# Patient Record
Sex: Female | Born: 1975 | Hispanic: No | Smoking: Never smoker
Health system: Southern US, Community
[De-identification: ages and names within clinical notes are randomized; demographics above are authoritative.]

## PROBLEM LIST (undated history)

## (undated) DIAGNOSIS — E079 Disorder of thyroid, unspecified: Secondary | ICD-10-CM

## (undated) DIAGNOSIS — IMO0002 Reserved for concepts with insufficient information to code with codable children: Secondary | ICD-10-CM

## (undated) DIAGNOSIS — E039 Hypothyroidism, unspecified: Secondary | ICD-10-CM

## (undated) DIAGNOSIS — T7840XA Allergy, unspecified, initial encounter: Secondary | ICD-10-CM

## (undated) DIAGNOSIS — L509 Urticaria, unspecified: Secondary | ICD-10-CM

## (undated) DIAGNOSIS — F329 Major depressive disorder, single episode, unspecified: Secondary | ICD-10-CM

## (undated) DIAGNOSIS — J45909 Unspecified asthma, uncomplicated: Secondary | ICD-10-CM

## (undated) DIAGNOSIS — D649 Anemia, unspecified: Secondary | ICD-10-CM

## (undated) HISTORY — DX: Unspecified asthma, uncomplicated: J45.909

## (undated) HISTORY — DX: Allergy, unspecified, initial encounter: T78.40XA

## (undated) HISTORY — DX: Hypothyroidism, unspecified: E03.9

## (undated) HISTORY — PX: NO PAST SURGERIES: SHX2092

## (undated) HISTORY — DX: Anemia, unspecified: D64.9

## (undated) HISTORY — DX: Urticaria, unspecified: L50.9

## (undated) HISTORY — DX: Reserved for concepts with insufficient information to code with codable children: IMO0002

## (undated) HISTORY — DX: Major depressive disorder, single episode, unspecified: F32.9

---

## 2001-02-25 ENCOUNTER — Encounter: Payer: Self-pay | Admitting: Emergency Medicine

## 2001-02-25 ENCOUNTER — Emergency Department (HOSPITAL_COMMUNITY): Admission: EM | Admit: 2001-02-25 | Discharge: 2001-02-25 | Payer: Self-pay | Admitting: Emergency Medicine

## 2003-03-14 ENCOUNTER — Encounter: Payer: Self-pay | Admitting: Emergency Medicine

## 2003-03-14 ENCOUNTER — Emergency Department (HOSPITAL_COMMUNITY): Admission: EM | Admit: 2003-03-14 | Discharge: 2003-03-14 | Payer: Self-pay | Admitting: Emergency Medicine

## 2004-12-01 ENCOUNTER — Ambulatory Visit: Payer: Self-pay | Admitting: Internal Medicine

## 2005-02-01 ENCOUNTER — Emergency Department (HOSPITAL_COMMUNITY): Admission: EM | Admit: 2005-02-01 | Discharge: 2005-02-01 | Payer: Self-pay | Admitting: Emergency Medicine

## 2005-04-13 ENCOUNTER — Emergency Department (HOSPITAL_COMMUNITY): Admission: EM | Admit: 2005-04-13 | Discharge: 2005-04-13 | Payer: Self-pay | Admitting: Emergency Medicine

## 2005-06-23 ENCOUNTER — Ambulatory Visit: Payer: Self-pay | Admitting: Internal Medicine

## 2005-06-24 ENCOUNTER — Ambulatory Visit: Payer: Self-pay | Admitting: Internal Medicine

## 2005-06-25 ENCOUNTER — Ambulatory Visit: Payer: Self-pay | Admitting: Internal Medicine

## 2005-09-02 ENCOUNTER — Ambulatory Visit: Payer: Self-pay | Admitting: Internal Medicine

## 2005-09-16 ENCOUNTER — Ambulatory Visit: Payer: Self-pay | Admitting: *Deleted

## 2005-09-22 ENCOUNTER — Emergency Department (HOSPITAL_COMMUNITY): Admission: EM | Admit: 2005-09-22 | Discharge: 2005-09-22 | Payer: Self-pay | Admitting: Emergency Medicine

## 2005-10-21 ENCOUNTER — Ambulatory Visit: Payer: Self-pay | Admitting: Internal Medicine

## 2008-09-07 ENCOUNTER — Emergency Department (HOSPITAL_COMMUNITY): Admission: EM | Admit: 2008-09-07 | Discharge: 2008-09-07 | Payer: Self-pay | Admitting: Emergency Medicine

## 2010-01-10 ENCOUNTER — Inpatient Hospital Stay (HOSPITAL_COMMUNITY): Admission: AD | Admit: 2010-01-10 | Discharge: 2010-01-10 | Payer: Self-pay | Admitting: Obstetrics & Gynecology

## 2010-03-26 ENCOUNTER — Ambulatory Visit (HOSPITAL_COMMUNITY): Admission: RE | Admit: 2010-03-26 | Discharge: 2010-03-26 | Payer: Self-pay | Admitting: Obstetrics

## 2010-06-04 ENCOUNTER — Inpatient Hospital Stay (HOSPITAL_COMMUNITY): Admission: AD | Admit: 2010-06-04 | Discharge: 2010-06-04 | Payer: Self-pay | Admitting: Obstetrics

## 2010-06-04 ENCOUNTER — Ambulatory Visit: Payer: Self-pay | Admitting: Advanced Practice Midwife

## 2010-07-06 ENCOUNTER — Ambulatory Visit: Payer: Self-pay | Admitting: Nurse Practitioner

## 2010-07-06 ENCOUNTER — Inpatient Hospital Stay (HOSPITAL_COMMUNITY): Admission: AD | Admit: 2010-07-06 | Discharge: 2010-07-06 | Payer: Self-pay | Admitting: Obstetrics

## 2010-08-19 ENCOUNTER — Inpatient Hospital Stay (HOSPITAL_COMMUNITY): Admission: AD | Admit: 2010-08-19 | Discharge: 2010-08-22 | Payer: Self-pay | Admitting: Obstetrics

## 2010-08-20 ENCOUNTER — Encounter (INDEPENDENT_AMBULATORY_CARE_PROVIDER_SITE_OTHER): Payer: Self-pay | Admitting: Obstetrics

## 2011-01-13 ENCOUNTER — Emergency Department (HOSPITAL_COMMUNITY)
Admission: EM | Admit: 2011-01-13 | Discharge: 2011-01-13 | Payer: Self-pay | Source: Home / Self Care | Admitting: Emergency Medicine

## 2011-02-26 LAB — CBC
Hemoglobin: 12.9 g/dL (ref 12.0–15.0)
MCH: 29.2 pg (ref 26.0–34.0)
MCHC: 33.4 g/dL (ref 30.0–36.0)
MCHC: 33.5 g/dL (ref 30.0–36.0)
MCV: 86.1 fL (ref 78.0–100.0)
MCV: 87.6 fL (ref 78.0–100.0)
Platelets: 207 10*3/uL (ref 150–400)
RBC: 3.6 MIL/uL — ABNORMAL LOW (ref 3.87–5.11)
RBC: 4.47 MIL/uL (ref 3.87–5.11)
RDW: 15.2 % (ref 11.5–15.5)
WBC: 10 10*3/uL (ref 4.0–10.5)
WBC: 8.8 10*3/uL (ref 4.0–10.5)

## 2011-03-01 LAB — URINALYSIS, ROUTINE W REFLEX MICROSCOPIC
Bilirubin Urine: NEGATIVE
Ketones, ur: NEGATIVE mg/dL
Nitrite: NEGATIVE
Protein, ur: NEGATIVE mg/dL
Specific Gravity, Urine: 1.025 (ref 1.005–1.030)

## 2011-03-02 LAB — URINALYSIS, ROUTINE W REFLEX MICROSCOPIC: Glucose, UA: NEGATIVE mg/dL

## 2011-03-02 LAB — POCT PREGNANCY, URINE: Preg Test, Ur: POSITIVE

## 2011-08-29 ENCOUNTER — Emergency Department (HOSPITAL_COMMUNITY)
Admission: EM | Admit: 2011-08-29 | Discharge: 2011-08-29 | Disposition: A | Discharge status: Home / Self Care | Payer: Self-pay | Attending: Emergency Medicine | Admitting: Emergency Medicine

## 2011-08-29 DIAGNOSIS — X58XXXA Exposure to other specified factors, initial encounter: Secondary | ICD-10-CM | POA: Insufficient documentation

## 2011-08-29 DIAGNOSIS — H11419 Vascular abnormalities of conjunctiva, unspecified eye: Secondary | ICD-10-CM | POA: Insufficient documentation

## 2011-08-29 DIAGNOSIS — E0590 Thyrotoxicosis, unspecified without thyrotoxic crisis or storm: Secondary | ICD-10-CM | POA: Insufficient documentation

## 2011-08-29 DIAGNOSIS — H1089 Other conjunctivitis: Secondary | ICD-10-CM | POA: Insufficient documentation

## 2011-08-29 DIAGNOSIS — H5789 Other specified disorders of eye and adnexa: Secondary | ICD-10-CM | POA: Insufficient documentation

## 2011-08-29 DIAGNOSIS — S0500XA Injury of conjunctiva and corneal abrasion without foreign body, unspecified eye, initial encounter: Secondary | ICD-10-CM | POA: Insufficient documentation

## 2011-08-29 DIAGNOSIS — H1189 Other specified disorders of conjunctiva: Secondary | ICD-10-CM | POA: Insufficient documentation

## 2011-08-29 DIAGNOSIS — H53149 Visual discomfort, unspecified: Secondary | ICD-10-CM | POA: Insufficient documentation

## 2011-09-14 LAB — CBC
HCT: 35.2 — ABNORMAL LOW
MCHC: 32.3
MCV: 82.8
RBC: 4.25

## 2011-09-14 LAB — GC/CHLAMYDIA PROBE AMP, GENITAL: Chlamydia, DNA Probe: NEGATIVE

## 2011-09-14 LAB — URINALYSIS, ROUTINE W REFLEX MICROSCOPIC
Bilirubin Urine: NEGATIVE
Ketones, ur: NEGATIVE
Nitrite: NEGATIVE
Protein, ur: NEGATIVE
Specific Gravity, Urine: 1.026
Urobilinogen, UA: 1

## 2011-09-14 LAB — DIFFERENTIAL
Basophils Absolute: 0
Basophils Relative: 0
Eosinophils Relative: 2
Monocytes Absolute: 0.4
Monocytes Relative: 6
Neutrophils Relative %: 55

## 2011-09-14 LAB — WET PREP, GENITAL
Trich, Wet Prep: NONE SEEN
WBC, Wet Prep HPF POC: NONE SEEN
Yeast Wet Prep HPF POC: NONE SEEN

## 2011-09-14 LAB — POCT I-STAT, CHEM 8
Hemoglobin: 12.2
Sodium: 138

## 2012-02-23 ENCOUNTER — Other Ambulatory Visit (HOSPITAL_COMMUNITY): Payer: Self-pay | Admitting: Physician Assistant

## 2012-05-11 ENCOUNTER — Encounter (HOSPITAL_COMMUNITY): Payer: Self-pay | Admitting: Emergency Medicine

## 2012-05-11 ENCOUNTER — Emergency Department (HOSPITAL_COMMUNITY)
Admission: EM | Admit: 2012-05-11 | Discharge: 2012-05-11 | Disposition: A | Discharge status: Home / Self Care | Payer: Self-pay | Attending: Emergency Medicine | Admitting: Emergency Medicine

## 2012-05-11 ENCOUNTER — Emergency Department (HOSPITAL_COMMUNITY): Payer: Self-pay

## 2012-05-11 DIAGNOSIS — R0982 Postnasal drip: Secondary | ICD-10-CM | POA: Insufficient documentation

## 2012-05-11 DIAGNOSIS — R059 Cough, unspecified: Secondary | ICD-10-CM | POA: Insufficient documentation

## 2012-05-11 DIAGNOSIS — R079 Chest pain, unspecified: Secondary | ICD-10-CM | POA: Insufficient documentation

## 2012-05-11 DIAGNOSIS — L2989 Other pruritus: Secondary | ICD-10-CM | POA: Insufficient documentation

## 2012-05-11 DIAGNOSIS — J302 Other seasonal allergic rhinitis: Secondary | ICD-10-CM

## 2012-05-11 DIAGNOSIS — L298 Other pruritus: Secondary | ICD-10-CM | POA: Insufficient documentation

## 2012-05-11 DIAGNOSIS — J3489 Other specified disorders of nose and nasal sinuses: Secondary | ICD-10-CM | POA: Insufficient documentation

## 2012-05-11 DIAGNOSIS — R5381 Other malaise: Secondary | ICD-10-CM | POA: Insufficient documentation

## 2012-05-11 DIAGNOSIS — J309 Allergic rhinitis, unspecified: Secondary | ICD-10-CM | POA: Insufficient documentation

## 2012-05-11 DIAGNOSIS — D649 Anemia, unspecified: Secondary | ICD-10-CM | POA: Insufficient documentation

## 2012-05-11 DIAGNOSIS — R51 Headache: Secondary | ICD-10-CM | POA: Insufficient documentation

## 2012-05-11 DIAGNOSIS — R05 Cough: Secondary | ICD-10-CM | POA: Insufficient documentation

## 2012-05-11 HISTORY — DX: Disorder of thyroid, unspecified: E07.9

## 2012-05-11 LAB — CBC
HCT: 32 % — ABNORMAL LOW (ref 36.0–46.0)
MCHC: 33.1 g/dL (ref 30.0–36.0)
Platelets: 223 10*3/uL (ref 150–400)
RDW: 13.4 % (ref 11.5–15.5)

## 2012-05-11 LAB — DIFFERENTIAL
Basophils Absolute: 0 10*3/uL (ref 0.0–0.1)
Eosinophils Relative: 3 % (ref 0–5)
Lymphocytes Relative: 54 % — ABNORMAL HIGH (ref 12–46)
Neutro Abs: 2.5 10*3/uL (ref 1.7–7.7)

## 2012-05-11 LAB — POCT I-STAT, CHEM 8
Chloride: 105 mEq/L (ref 96–112)
Glucose, Bld: 88 mg/dL (ref 70–99)
Potassium: 3.6 mEq/L (ref 3.5–5.1)
Sodium: 141 mEq/L (ref 135–145)

## 2012-05-11 MED ORDER — CETIRIZINE HCL 10 MG PO TABS: 10.0000 mg | ORAL_TABLET | Freq: Every day | ORAL | Status: DC

## 2012-05-11 MED ORDER — FERROUS SULFATE 325 (65 FE) MG PO TABS: 325.0000 mg | ORAL_TABLET | Freq: Every day | ORAL | Status: DC

## 2012-05-11 NOTE — Discharge Instructions (Signed)
You were seen and evaluated for your symptoms of nasal congestion, runny nose, voice change and fatigue. Your providers feel your symptoms are caused from seasonal allergies. It is recommended you take a daily allergies medication to help with your symptoms over the next week. Your providers also feel you some of your fatigue and tiredness is caused from anemia. Please take iron vitamins daily for the next several weeks to help with these symptoms. Please followup with primary care provider for continued evaluation and treatment.   Allergies, Generic Allergies may happen from anything your body is sensitive to. This may be food, medicines, pollens, chemicals, and nearly anything around you in everyday life that produces allergens. An allergen is anything that causes an allergy producing substance. Heredity is often a factor in causing these problems. This means you may have some of the same allergies as your parents. Food allergies happen in all age groups. Food allergies are some of the most severe and life threatening. Some common food allergies are cow's milk, seafood, eggs, nuts, wheat, and soybeans. SYMPTOMS   Swelling around the mouth.   An itchy red rash or hives.   Vomiting or diarrhea.   Difficulty breathing.  SEVERE ALLERGIC REACTIONS ARE LIFE-THREATENING. This reaction is called anaphylaxis. It can cause the mouth and throat to swell and cause difficulty with breathing and swallowing. In severe reactions only a trace amount of food (for example, peanut oil in a salad) may cause death within seconds. Seasonal allergies occur in all age groups. These are seasonal because they usually occur during the same season every year. They may be a reaction to molds, grass pollens, or tree pollens. Other causes of problems are house dust mite allergens, pet dander, and mold spores. The symptoms often consist of nasal congestion, a runny itchy nose associated with sneezing, and tearing itchy eyes.  There is often an associated itching of the mouth and ears. The problems happen when you come in contact with pollens and other allergens. Allergens are the particles in the air that the body reacts to with an allergic reaction. This causes you to release allergic antibodies. Through a chain of events, these eventually cause you to release histamine into the blood stream. Although it is meant to be protective to the body, it is this release that causes your discomfort. This is why you were given anti-histamines to feel better. If you are unable to pinpoint the offending allergen, it may be determined by skin or blood testing. Allergies cannot be cured but can be controlled with medicine. Hay fever is a collection of all or some of the seasonal allergy problems. It may often be treated with simple over-the-counter medicine such as diphenhydramine. Take medicine as directed. Do not drink alcohol or drive while taking this medicine. Check with your caregiver or package insert for child dosages. If these medicines are not effective, there are many new medicines your caregiver can prescribe. Stronger medicine such as nasal spray, eye drops, and corticosteroids may be used if the first things you try do not work well. Other treatments such as immunotherapy or desensitizing injections can be used if all else fails. Follow up with your caregiver if problems continue. These seasonal allergies are usually not life threatening. They are generally more of a nuisance that can often be handled using medicine. HOME CARE INSTRUCTIONS   If unsure what causes a reaction, keep a diary of foods eaten and symptoms that follow. Avoid foods that cause reactions.   If hives  or rash are present:   Take medicine as directed.   You may use an over-the-counter antihistamine (diphenhydramine) for hives and itching as needed.   Apply cold compresses (cloths) to the skin or take baths in cool water. Avoid hot baths or showers. Heat  will make a rash and itching worse.   If you are severely allergic:   Following a treatment for a severe reaction, hospitalization is often required for closer follow-up.   Wear a medic-alert bracelet or necklace stating the allergy.   You and your family must learn how to give adrenaline or use an anaphylaxis kit.   If you have had a severe reaction, always carry your anaphylaxis kit or EpiPen with you. Use this medicine as directed by your caregiver if a severe reaction is occurring. Failure to do so could have a fatal outcome.  SEEK MEDICAL CARE IF:  You suspect a food allergy. Symptoms generally happen within 30 minutes of eating a food.   Your symptoms have not gone away within 2 days or are getting worse.   You develop new symptoms.   You want to retest yourself or your child with a food or drink you think causes an allergic reaction. Never do this if an anaphylactic reaction to that food or drink has happened before. Only do this under the care of a caregiver.  SEEK IMMEDIATE MEDICAL CARE IF:   You have difficulty breathing, are wheezing, or have a tight feeling in your chest or throat.   You have a swollen mouth, or you have hives, swelling, or itching all over your body.   You have had a severe reaction that has responded to your anaphylaxis kit or an EpiPen. These reactions may return when the medicine has worn off. These reactions should be considered life threatening.  MAKE SURE YOU:   Understand these instructions.   Will watch your condition.   Will get help right away if you are not doing well or get worse.  Document Released: 02/23/2003 Document Revised: 11/19/2011 Document Reviewed: 07/30/2008 [facility] Patient Information 2012 [place], [place].    Iron Deficiency Anemia There are many types of anemia. Iron deficiency anemia is the most common. Iron deficiency anemia is a decrease in the number of red blood cells caused by too little iron. Without enough iron,  your body does not produce enough hemoglobin. Hemoglobin is a substance in red blood cells that carries oxygen to the body's tissues. Iron deficiency anemia may leave you tired and short of breath. CAUSES   Lack of iron in the diet.   This may be seen in infants and children, because there is little iron in milk.   This may be seen in adults who do not eat enough iron-rich foods.   This may be seen in pregnant or breastfeeding women who do not take iron supplements. There is a much higher need for iron intake at these times.   Poor absorption of iron, as seen with intestinal disorders.   Intestinal bleeding.   Heavy periods.  SYMPTOMS  Mild anemia may not be noticeable. Symptoms may include:  Fatigue.   Headache.   Pale skin.   Weakness.   Shortness of breath.   Dizziness.   Cold hands and feet.   Fast or irregular heartbeat.  DIAGNOSIS  Diagnosis requires a thorough evaluation and physical exam by your caregiver.  Blood tests are generally used to confirm iron deficiency anemia.   Additional tests may be done to find the underlying  cause of your anemia. These may include:   Testing for blood in the stool (fecal occult blood test).   A procedure to see inside the colon and rectum (colonoscopy).   A procedure to see inside the esophagus and stomach (endoscopy).  TREATMENT   Correcting the cause of the iron deficiency is the first step.   Medicines, such as oral contraceptives, can make heavy menstrual flows lighter.   Antibiotics and other medicines can be used to treat peptic ulcers.   Surgery may be needed to remove a bleeding polyp, tumor, or fibroid.   Often, iron supplements (ferrous sulfate) are taken.   For the best iron absorption, take these supplements with an empty stomach.   You may need to take the supplements with food if you cannot tolerate them on an empty stomach. Vitamin C improves the absorption of iron. Your caregiver may recommend taking  your iron tablets with a glass of orange juice or vitamin C supplement.   Milk and antacids should not be taken at the same time as iron supplements. They may interfere with the absorption of iron.   Iron supplements can cause constipation. A stool softener is often recommended.   Pregnant and breastfeeding women will need to take extra iron, because their normal diet usually will not provide the required amount.   Patients who cannot tolerate iron by mouth can take it through a vein (intravenously) or by an injection into the muscle.  HOME CARE INSTRUCTIONS   Ask your dietitian for help with diet questions.   Take iron and vitamins as directed by your caregiver.   Eat a diet rich in iron. Eat liver, lean beef, whole-grain bread, eggs, dried fruit, and dark green leafy vegetables.  SEEK IMMEDIATE MEDICAL CARE IF:   You have a fainting episode. Do not drive yourself. Call your local emergency services (911 in [place] if no other help is available.   You have chest pain, nausea, or vomiting.   You develop severe or increased shortness of breath with activities.   You develop weakness or increased thirst.   You have a rapid heartbeat.   You develop unexplained sweating or become lightheaded when getting up from a chair or bed.  MAKE SURE YOU:   Understand these instructions.   Will watch your condition.   Will get help right away if you are not doing well or get worse.  Document Released: 11/27/2000 Document Revised: 11/19/2011 Document Reviewed: 04/08/2010 [facility] Patient Information 2012 [place], [place].   RESOURCE GUIDE  Dental Problems  Patients with Medicaid: [facility] [phone] [address]                                           [phone] [address] Phone:  [phone]                                                  Phone:  [phone]  If unable to pay or uninsured, contact:  [facility] or [facility]. to become qualified for the adult dental clinic.  Chronic Pain Problems Contact [facility]  [facility]  [phone] Patients need to be referred by their primary care doctor.  Insufficient Money for Medicine Contact United Way:  call "211" or [facility] [phone].  No Primary Care Doctor Call [facility]  [phone] Other agencies that provide inexpensive medical care    [facility]  [phone]    [facility]  [phone]    [facility]  [phone]    [facility]  [phone]    [facility]  [phone]    [facility]  [phone]  Psychological Services [facility]  [phone] [facility] Services  [phone] [facility]   [phone] (emergency services [phone])  Substance Abuse Resources [facility]  [phone] [facility] [phone] [facility] [phone] Floydene Flock [phone] Residential & Outpatient Substance Abuse Program  [phone]  Abuse/Neglect [facility] Child Abuse Hotline [phone] [facility] Child Abuse Hotline [phone] (After Hours)  Emergency Shelter [facility] [phone]  Maternity Homes [facility] [place] [facility] [phone] [facility] [phone]  MRSA Hotline #:   516-042-2124    [facility] Resources  [facility] [place]     [employer]                          [facility] Dept. [address] [place]                       [address]      [address]  Blondell Reveal Phone:  [phone]                                   Phone:  [phone]                 Phone:  [phone]  [facility] Phone:  [phone]  [facility] Child Abuse Hotline 918-831-8513 [phone] (After Hours)

## 2012-05-11 NOTE — ED Provider Notes (Signed)
History     CSN: [id number]  Arrival date & time 05/11/12  7829   First MD Initiated Contact with Patient 05/11/12 2119      Chief Complaint  Patient presents with  . Headache  . Chest Pain    HPI  History provided by the patient. Patient is a 36 year old female with history of hypothyroidism who presents with complaints of generalized fatigue, runny nose, congestion, itchy and watery eyes for the past several weeks to months. Symptoms are mild to moderate. Patient states that she thought her symptoms may be allergies she is used an occasional allergy medicine but nothing regularly. She also reports occasional cough with some sputum, most typically in the mornings. She denies any other aggravating or alleviating factors. Patient denies any fever, chills, sweats.   Past Medical History  Diagnosis Date  . Thyroid disease     History reviewed. No pertinent past surgical history.  History reviewed. No pertinent family history.  History  Substance Use Topics  . Smoking status: Never Smoker   . Smokeless tobacco: Not on file  . Alcohol Use: No     socailly    OB History    Grav Para Term Preterm Abortions TAB SAB Ect Mult Living                  Review of Systems  Constitutional: Positive for fatigue. Negative for fever and chills.  HENT: Positive for congestion, sore throat, rhinorrhea and postnasal drip.   Respiratory: Positive for cough. Negative for shortness of breath.   Cardiovascular: Negative for chest pain, palpitations and leg swelling.  Gastrointestinal: Negative for nausea, vomiting, abdominal pain and diarrhea.  Genitourinary: Negative for dysuria, frequency, hematuria and flank pain.    Allergies  Benzyl alcohol and Nickel  Home Medications   Current Outpatient Rx  Name Route Sig Dispense Refill  . ALLERGY AM/PM PO Oral Take 1 tablet by mouth every evening. For allergies      BP 111/81  Pulse 83  Temp(Src) 99 F (37.2 C) (Oral)  Resp 20  SpO2  100%  Physical Exam  Nursing note and vitals reviewed. Constitutional: She is oriented to person, place, and time. She appears well-developed and well-nourished. No distress.  HENT:  Head: Normocephalic and atraumatic.  Right Ear: Tympanic membrane normal.  Left Ear: Tympanic membrane normal.       Mild erythema of pharynx with cobblestoning. Tonsils normal without exudate. Uvula midline.  Neck: Normal range of motion. Neck supple. No tracheal deviation present.       Hoarse voice.  Cardiovascular: Normal rate and regular rhythm.   Pulmonary/Chest: Effort normal and breath sounds normal. No stridor. No respiratory distress. She has no wheezes. She has no rales.  Abdominal: Soft. There is no tenderness.  Musculoskeletal: She exhibits no edema and no tenderness.  Lymphadenopathy:    She has no cervical adenopathy.  Neurological: She is alert and oriented to person, place, and time.  Skin: Skin is warm and dry. No rash noted.  Psychiatric: She has a normal mood and affect. Her behavior is normal.    ED Course  Procedures  Results for orders placed during the hospital encounter of 05/11/12  CBC      Component Value Range   WBC 6.4  4.0 - 10.5 (K/uL)   RBC 3.55 (*) 3.87 - 5.11 (MIL/uL)   Hemoglobin 10.6 (*) 12.0 - 15.0 (g/dL)   HCT 56.2 (*) 13.0 - 46.0 (%)   MCV 90.1  78.0 -  100.0 (fL)   MCH 29.9  26.0 - 34.0 (pg)   MCHC 33.1  30.0 - 36.0 (g/dL)   RDW 16.1  09.6 - 04.5 (%)   Platelets 223  150 - 400 (K/uL)  DIFFERENTIAL      Component Value Range   Neutrophils Relative 39 (*) 43 - 77 (%)   Neutro Abs 2.5  1.7 - 7.7 (K/uL)   Lymphocytes Relative 54 (*) 12 - 46 (%)   Lymphs Abs 3.5  0.7 - 4.0 (K/uL)   Monocytes Relative 4  3 - 12 (%)   Monocytes Absolute 0.2  0.1 - 1.0 (K/uL)   Eosinophils Relative 3  0 - 5 (%)   Eosinophils Absolute 0.2  0.0 - 0.7 (K/uL)   Basophils Relative 1  0 - 1 (%)   Basophils Absolute 0.0  0.0 - 0.1 (K/uL)  POCT I-STAT, CHEM 8      Component Value  Range   Sodium 141  135 - 145 (mEq/L)   Potassium 3.6  3.5 - 5.1 (mEq/L)   Chloride 105  96 - 112 (mEq/L)   BUN 14  6 - 23 (mg/dL)   Creatinine, Ser 4.09  0.50 - 1.10 (mg/dL)   Glucose, Bld 88  70 - 99 (mg/dL)   Calcium, Ion 8.11  9.14 - 1.32 (mmol/L)   TCO2 29  0 - 100 (mmol/L)   Hemoglobin 11.2 (*) 12.0 - 15.0 (g/dL)   HCT 78.2 (*) 95.6 - 46.0 (%)       Dg Chest 2 View  05/11/2012  *RADIOLOGY REPORT*  Clinical Data: Pain and cough.  CHEST - 2 VIEW  Comparison: 04/13/2005  Findings: The heart size and pulmonary vascularity are normal. The lungs appear clear and expanded without focal air space disease or consolidation. No blunting of the costophrenic angles.  No pneumothorax.  No significant change since previous study.  IMPRESSION: No evidence of active pulmonary disease.  Original Report Authenticated By: Marlon Pel, M.D.     1. Seasonal allergies   2. Postnasal drip   3. Anemia       MDM  Patient seen and evaluated. Patient no acute distress.        Angus Seller, [place] 05/12/12 [phone]

## 2012-05-11 NOTE — ED Notes (Addendum)
Patient with headahce, runny nose, congestion.  Patient states drainage from head is green.  States she has been like this for a few months.  Patient states she is also having chest pain.

## 2012-05-13 NOTE — ED Provider Notes (Signed)
Medical screening examination/treatment/procedure(s) were performed by non-physician practitioner and as supervising physician I was immediately available for consultation/collaboration.   Genean Adamski III, MD 05/13/12 1122 

## 2012-07-01 ENCOUNTER — Encounter: Payer: Self-pay | Admitting: Family Medicine

## 2012-07-01 ENCOUNTER — Ambulatory Visit (INDEPENDENT_AMBULATORY_CARE_PROVIDER_SITE_OTHER): Payer: Self-pay | Admitting: Family Medicine

## 2012-07-01 VITALS — BP 116/87 | HR 80 | Ht 61.5 in | Wt 126.0 lb

## 2012-07-01 DIAGNOSIS — F32A Depression, unspecified: Secondary | ICD-10-CM | POA: Insufficient documentation

## 2012-07-01 DIAGNOSIS — F3289 Other specified depressive episodes: Secondary | ICD-10-CM

## 2012-07-01 DIAGNOSIS — R5381 Other malaise: Secondary | ICD-10-CM

## 2012-07-01 DIAGNOSIS — R5383 Other fatigue: Secondary | ICD-10-CM

## 2012-07-01 DIAGNOSIS — F329 Major depressive disorder, single episode, unspecified: Secondary | ICD-10-CM

## 2012-07-01 HISTORY — DX: Depression, unspecified: F32.A

## 2012-07-01 LAB — CBC WITH DIFFERENTIAL/PLATELET
Eosinophils Absolute: 0.2 10*3/uL (ref 0.0–0.7)
Eosinophils Relative: 3 % (ref 0–5)
Hemoglobin: 10.9 g/dL — ABNORMAL LOW (ref 12.0–15.0)
Lymphocytes Relative: 47 % — ABNORMAL HIGH (ref 12–46)
Lymphs Abs: 2.6 10*3/uL (ref 0.7–4.0)
MCH: 30.4 pg (ref 26.0–34.0)
MCV: 87.2 fL (ref 78.0–100.0)
Monocytes Relative: 4 % (ref 3–12)
RBC: 3.59 MIL/uL — ABNORMAL LOW (ref 3.87–5.11)
WBC: 5.6 10*3/uL (ref 4.0–10.5)

## 2012-07-01 LAB — HEPATIC FUNCTION PANEL
AST: 54 U/L — ABNORMAL HIGH (ref 0–37)
Albumin: 4.5 g/dL (ref 3.5–5.2)
Alkaline Phosphatase: 47 U/L (ref 39–117)
Indirect Bilirubin: 0.3 mg/dL (ref 0.0–0.9)
Total Bilirubin: 0.4 mg/dL (ref 0.3–1.2)
Total Protein: 7.8 g/dL (ref 6.0–8.3)

## 2012-07-01 MED ORDER — FLUOXETINE HCL 20 MG PO CAPS: 20.0000 mg | ORAL_CAPSULE | Freq: Every day | ORAL | Status: DC

## 2012-07-01 NOTE — Patient Instructions (Addendum)
It was very nice to meet you today.   I have sent a prescription for Prozac to your pharmacy. You will take this once per day. Please call me if you have any problems with the medicine.  I would like you to start taking iron. You can buy this over the counter.  The bottle you should look for is: Ferrous Sulfate 325 mg.  Start out taking an iron pill once a day, and if it does not bother your stomach, gradually increase it to three times a day. This may help your blood counts and make you less tired.  We are checking some blood work. I will call you if any of your labs are not good; otherwise, we will talk about them at your next appointment.  On your way out, please make an appointment to come back in 3-4 weeks.

## 2012-07-01 NOTE — Assessment & Plan Note (Signed)
PHQ-9 score is 13 = moderate depression. Pt willing to take a medicine to help with her mood. No SI/HI. Will prescribe fluoxetine 20mg  and have pt f/u in 3-4 weeks. Discussed possible side effects (sexual, GI).

## 2012-07-01 NOTE — Assessment & Plan Note (Addendum)
Unclear etiology. Possibly hypothyroidism given prior hx of thyroid disease, but could also be due to depression as PHQ-9 indicates moderate depression. No red flags (night sweats, weight loss, fevers) but does have some anterior cervical lymphadenopathy and a chronically hoarse voice. Will check TSH, sed rate (action threshold >80), LFT's, CBC, and ferritin today. Will also go ahead and initiate iron studies as pt is likely iron deficient given prior Hb of 11.2 earlier this year. Will f/u in appt at 3-4 weeks from now.  LATE ENTRY ADDENDUM 07/12/12 3:15PM: Lab results reviewed; I spoke with patient on 7/24 about her lab results (see telephone note). She continues to be anemic, but we d/c'd supplemental iron as her ferritin is normal. Sed rate not concerning. AST midly elevated - will follow. TSH markedly elevated - will start on Synthroid (taper up to 100 mcg/day) Continue Prozac 20mg . Has f/u appointment with Dr. Ashley Royalty on August 7 - can reassess fatigue and depression symptoms at that time.

## 2012-07-01 NOTE — Progress Notes (Addendum)
Patient ID: Paula Heath, female   DOB: 1976-10-12, 36 y.o.   MRN: [id number]  HPI: Pt is a 36 yo female who presents for a new patient visit to establish care and initiate getting a [facility] card.   FATIGUE: Her primary complaint today is fatigue. She feels like she has mood swings, is depressed, falls asleep easily, has pain in her muscles, legs, ankles, and wrists. She has a hx of thyroid disease during a prior pregnancy and took medicine for her thyroid during that time, the name of which she is not able to recall. She feels cold all the time. She denies weight loss, fevers, or night sweats. She also feels like her voice is hoarse most of the time and feels enlarged lumps in her neck. She has been told she is anemic and was recently put on iron pills after visiting the ED for seasonal allergies. Also historically notes that when she was pregnant several years ago she had a burning pain in her hands that did not go away even when holding an ice pack.  ANXIETY: She also has a history of panic attacks and has chest pain during these attacks. At one time she took medicine for the panic attacks (cannot recall name of medicine), which gave her mood swings so she stopped taking it. The chest pain she gets is not worse with exertion. The attacks are triggered by stressful situations but can occur randomly. They have picked up in the last year. Denies SI/HI. Also denies periods of extremely increased mood, extravagant spending, or thoughts of invincibility.  PMH: Reviewed with patient and documented in chart. Has hx of bleeding stomach ulcers many years ago, and hx of asthma as a child. No surgeries, medications, or allergies.  Social History: doesn't smoke, rare alcohol use, no drugs. Not sexually active currently, but has implanon. She and her husband are separated.  Exam: Gen: NAD, pleasant, cooperative Eyes: watery cleary Neck: two ~2cm enlarged lymph nodes, nontender to palpation Lungs:  CTAB Heart: RRR, no m/r/g Psych: affect shows full range, no SI/HI, appropriate eye contact  PHQ-9 administered, result is 13, indicating moderate depression.

## 2012-07-06 ENCOUNTER — Telehealth: Payer: Self-pay | Admitting: Family Medicine

## 2012-07-06 MED ORDER — LEVOTHYROXINE SODIUM 50 MCG PO TABS: ORAL_TABLET | ORAL | Status: DC

## 2012-07-06 NOTE — Telephone Encounter (Signed)
Called and spoke with patient about her lab results. She is hypothyroid, so I told her I will send a prescription to [facility] for her thyroid. Also told her she can stop taking the iron supplement as her ferritin levels were normal. She will keep her f/u appt with Dr. Ashley Royalty on August 7.

## 2012-07-20 ENCOUNTER — Ambulatory Visit: Payer: Self-pay | Admitting: Family Medicine

## 2012-07-25 ENCOUNTER — Other Ambulatory Visit: Payer: Self-pay | Admitting: *Deleted

## 2012-07-26 NOTE — Telephone Encounter (Signed)
Please call pt and let her know that she needs to make an appointment to be seen before I will refill this medication. Once she schedules the appointment, I will be happy to refill this.

## 2012-07-26 NOTE — Telephone Encounter (Signed)
Called patient and she has an appointment on 08/26/12. Forward to PCP to refill Rx to make it to that appointment.[name], [name]

## 2012-07-27 MED ORDER — LEVOTHYROXINE SODIUM 100 MCG PO TABS: 100.0000 ug | ORAL_TABLET | Freq: Every day | ORAL | Status: DC

## 2012-08-26 ENCOUNTER — Encounter: Payer: Self-pay | Admitting: Family Medicine

## 2012-08-26 ENCOUNTER — Ambulatory Visit (INDEPENDENT_AMBULATORY_CARE_PROVIDER_SITE_OTHER): Payer: Self-pay | Admitting: Family Medicine

## 2012-08-26 VITALS — BP 112/78 | HR 73 | Ht 61.5 in | Wt 117.0 lb

## 2012-08-26 DIAGNOSIS — E039 Hypothyroidism, unspecified: Secondary | ICD-10-CM

## 2012-08-26 DIAGNOSIS — F3289 Other specified depressive episodes: Secondary | ICD-10-CM

## 2012-08-26 DIAGNOSIS — F32A Depression, unspecified: Secondary | ICD-10-CM

## 2012-08-26 DIAGNOSIS — F329 Major depressive disorder, single episode, unspecified: Secondary | ICD-10-CM

## 2012-08-26 DIAGNOSIS — Z0000 Encounter for general adult medical examination without abnormal findings: Secondary | ICD-10-CM | POA: Insufficient documentation

## 2012-08-26 DIAGNOSIS — Z23 Encounter for immunization: Secondary | ICD-10-CM

## 2012-08-26 HISTORY — DX: Hypothyroidism, unspecified: E03.9

## 2012-08-26 MED ORDER — TETANUS-DIPHTH-ACELL PERTUSSIS 5-2.5-18.5 LF-MCG/0.5 IM SUSP: 0.5000 mL | Freq: Once | INTRAMUSCULAR | Status: DC

## 2012-08-26 MED ORDER — FLUOXETINE HCL 20 MG PO CAPS: 20.0000 mg | ORAL_CAPSULE | Freq: Every day | ORAL | Status: DC

## 2012-08-26 NOTE — Progress Notes (Signed)
Patient ID: Paula Heath, female   DOB: November 29, 1976, 36 y.o.   MRN: [id number]  HPI: [name] is a 36 y.o. female here to follow up on her hypothyroidism and depression. Last visit, we started her on of synthroid per day, and also fluoxetine 20mg  daily. She notes marked improvements in her symptoms. She is less tired than she used to be, and notes that she has lost some weight, which is likes. She is still cold a lot of the time. She feels like her mood has been good, with only one episode of tearfulness that occurred last night for 30-40 minutes after her husband, from whom she is separated, came to pick up their daughter early. She is able to fall asleep but does wake up around 3 times at night. Appetite has been good. Denies SI/HI.  She is not interested in a flu shot, but is willing to get a tetanus shot today as she is not sure when she last had one.  ROS as per HPI  Exam: Gen: NAD, pleasant, cooperative HEENT: a few small lymph nodes submandibular and anterior cervical. Does have a visible & palpable goiter. Heart: RRR Lungs: CTAB Psych: appropriate eye contact, full range of affect

## 2012-08-26 NOTE — Assessment & Plan Note (Signed)
Mood improved. No signs of SI/HI. Pt would like to continue Prozac 20mg  daily. Have given her 3 more months worth. She will follow up in 6 weeks for a physical, at which time we can reassess mood as needed.

## 2012-08-26 NOTE — Assessment & Plan Note (Signed)
Diagnosed last visit on 7/19 with TSH of 74.025; started on Synthroid daily. Symptoms have much improved since starting synthroid & fluoxetine. Will recheck TSH & free T4 today and titrate synthroid as needed. I expect we may need to go up on her synthroid some. She will follow up in 6 weeks for a physical, at which time we can check TSH again if needed.

## 2012-08-26 NOTE — Assessment & Plan Note (Signed)
Tetanus vaccine given today. Will need pap and one-time lipid panel at next visit in Oct 2013. She will come fasting for this visit. Refuses flu shot today.

## 2012-08-26 NOTE — Patient Instructions (Signed)
Great to see you again today. I am glad you are feeling better!  I or a nurse will call you early next week when we have your thyroid test back to let you know if we need to change the medication. I will send you in a refill at that time. I have already sent in a refill of the Prozac.  I will see you back in October for your physical, at which time we can recheck your thyroid if necessary. Please come fasting (no breakfast) so that we can check your cholesterol.

## 2012-08-27 LAB — T4, FREE: Free T4: 1.05 ng/dL (ref 0.80–1.80)

## 2012-08-27 LAB — TSH: TSH: 24.719 u[IU]/mL — ABNORMAL HIGH (ref 0.350–4.500)

## 2012-08-29 ENCOUNTER — Telehealth: Payer: Self-pay | Admitting: Family Medicine

## 2012-08-29 MED ORDER — LEVOTHYROXINE SODIUM 125 MCG PO TABS: 125.0000 ug | ORAL_TABLET | Freq: Every day | ORAL | Status: DC

## 2012-08-29 NOTE — Telephone Encounter (Signed)
Called patient and spoke with her to let her know TSH high and need to increase medication. Will send rx over to pharmacy now. Otherwise she is doing well.

## 2012-10-10 ENCOUNTER — Encounter: Payer: Self-pay | Admitting: Family Medicine

## 2012-10-10 ENCOUNTER — Ambulatory Visit (INDEPENDENT_AMBULATORY_CARE_PROVIDER_SITE_OTHER): Payer: Medicaid Other | Admitting: Family Medicine

## 2012-10-10 ENCOUNTER — Other Ambulatory Visit (HOSPITAL_COMMUNITY)
Admission: RE | Admit: 2012-10-10 | Discharge: 2012-10-10 | Disposition: A | Discharge status: Home / Self Care | Payer: Medicaid Other | Source: Ambulatory Visit | Attending: Family Medicine | Admitting: Family Medicine

## 2012-10-10 VITALS — BP 114/80 | HR 72 | Ht 61.5 in | Wt 115.0 lb

## 2012-10-10 DIAGNOSIS — F329 Major depressive disorder, single episode, unspecified: Secondary | ICD-10-CM

## 2012-10-10 DIAGNOSIS — Z0000 Encounter for general adult medical examination without abnormal findings: Secondary | ICD-10-CM

## 2012-10-10 DIAGNOSIS — F32A Depression, unspecified: Secondary | ICD-10-CM

## 2012-10-10 DIAGNOSIS — R519 Headache, unspecified: Secondary | ICD-10-CM | POA: Insufficient documentation

## 2012-10-10 DIAGNOSIS — Z01419 Encounter for gynecological examination (general) (routine) without abnormal findings: Secondary | ICD-10-CM | POA: Insufficient documentation

## 2012-10-10 DIAGNOSIS — Z1151 Encounter for screening for human papillomavirus (HPV): Secondary | ICD-10-CM | POA: Insufficient documentation

## 2012-10-10 DIAGNOSIS — F3289 Other specified depressive episodes: Secondary | ICD-10-CM

## 2012-10-10 DIAGNOSIS — Z124 Encounter for screening for malignant neoplasm of cervix: Secondary | ICD-10-CM

## 2012-10-10 DIAGNOSIS — Z23 Encounter for immunization: Secondary | ICD-10-CM

## 2012-10-10 DIAGNOSIS — F410 Panic disorder [episodic paroxysmal anxiety] without agoraphobia: Secondary | ICD-10-CM | POA: Insufficient documentation

## 2012-10-10 DIAGNOSIS — R51 Headache: Secondary | ICD-10-CM

## 2012-10-10 DIAGNOSIS — E039 Hypothyroidism, unspecified: Secondary | ICD-10-CM

## 2012-10-10 LAB — LIPID PANEL
HDL: 59 mg/dL (ref >39)
Total CHOL/HDL Ratio: 3 Ratio
Triglycerides: 48 mg/dL (ref <150)

## 2012-10-10 LAB — TSH: TSH: 11.829 u[IU]/mL — ABNORMAL HIGH (ref 0.350–4.500)

## 2012-10-10 NOTE — Assessment & Plan Note (Addendum)
Assessment: Stable. No signs of SI/HI.  PHQ-9 done today, with a score of 7 (mild depression).  GAD-7 done as well, with a score of 3 (minimal anxiety).  Pt reports difficulty with staying asleep at night. She is taking her medication prior to going to bed. Pt is having panic attacks, which she has a history of. She does not think they are hindering her functioning or are more frequent than previously, but has had several with no trigger, which is not usual for her. Offered to increase her prozac dose for better control of comorbid panic disorder, but she prefers to continue her current dose.   Plan: Continue Prozac 20mg  daily. Recommend taking prozac in AM instead of QHS as it is somewhat activating and may be contributing to her trouble sleeping. Gave pamphlet on [facility] for counseling, which should help with depression and anxiety. I would like her to f/u in 2-3 months to recheck her mood.

## 2012-10-10 NOTE — Assessment & Plan Note (Addendum)
Check pap+HPV today. Check one-time lipid panel today. Flu shot given today.

## 2012-10-10 NOTE — Assessment & Plan Note (Signed)
Check TSH today. May need to titrate up again. Currently taking synthroid 125 mcg per day.

## 2012-10-10 NOTE — Patient Instructions (Addendum)
It was great to see you again today!  For your headaches, try taking your medicine in the morning and drinking plenty of water throughout the day. You can take tylenol as needed for them as well. If they continue to be a problem, make an appointment to come back so we can talk more about them.  Call the [facility] to get set up with a counselor. This should help with your depression and anxiety.  I will call you if your test results are not normal.  Otherwise, I will send you a letter.  If you do not hear from me with in 2 weeks please call our office.       Insomnia Insomnia is frequent trouble falling and/or staying asleep. Insomnia can be a long term problem or a short term problem. Both are common. Insomnia can be a short term problem when the wakefulness is related to a certain stress or worry. Long term insomnia is often related to ongoing stress during waking hours and/or poor sleeping habits. Overtime, sleep deprivation itself can make the problem worse. Every little thing feels more severe because you are overtired and your ability to cope is decreased. CAUSES   Stress, anxiety, and depression.  Poor sleeping habits.  Distractions such as TV in the bedroom.  Naps close to bedtime.  Engaging in emotionally charged conversations before bed.  Technical reading before sleep.  Alcohol and other sedatives. They may make the problem worse. They can hurt normal sleep patterns and normal dream activity.  Stimulants such as caffeine for several hours prior to bedtime.  Pain syndromes and shortness of breath can cause insomnia.  Exercise late at night.  Changing time zones may cause sleeping problems (jet lag). It is sometimes helpful to have someone observe your sleeping patterns. They should look for periods of not breathing during the night (sleep apnea). They should also look to see how long those periods last. If you live alone or observers are uncertain, you can  also be observed at a sleep clinic where your sleep patterns will be professionally monitored. Sleep apnea requires a checkup and treatment. Give your caregivers your medical history. Give your caregivers observations your family has made about your sleep.  SYMPTOMS   Not feeling rested in the morning.  Anxiety and restlessness at bedtime.  Difficulty falling and staying asleep. TREATMENT   Your caregiver may prescribe treatment for an underlying medical disorders. Your caregiver can give advice or help if you are using alcohol or other drugs for self-medication. Treatment of underlying problems will usually eliminate insomnia problems.  Medications can be prescribed for short time use. They are generally not recommended for lengthy use.  Over-the-counter sleep medicines are not recommended for lengthy use. They can be habit forming.  You can promote easier sleeping by making lifestyle changes such as:  Using relaxation techniques that help with breathing and reduce muscle tension.  Exercising earlier in the day.  Changing your diet and the time of your last meal. No night time snacks.  Establish a regular time to go to bed.  Counseling can help with stressful problems and worry.  Soothing music and white noise may be helpful if there are background noises you cannot remove.  Stop tedious detailed work at least one hour before bedtime. HOME CARE INSTRUCTIONS   Keep a diary. Inform your caregiver about your progress. This includes any medication side effects. See your caregiver regularly. Take note of:  Times when you are asleep.  Times when you are awake during the night.  The quality of your sleep.  How you feel the next day. This information will help your caregiver care for you.  Get out of bed if you are still awake after 15 minutes. Read or do some quiet activity. Keep the lights down. Wait until you feel sleepy and go back to bed.  Keep regular sleeping and waking  hours. Avoid naps.  Exercise regularly.  Avoid distractions at bedtime. Distractions include watching television or engaging in any intense or detailed activity like attempting to balance the household checkbook.  Develop a bedtime ritual. Keep a familiar routine of bathing, brushing your teeth, climbing into bed at the same time each night, listening to soothing music. Routines increase the success of falling to sleep faster.  Use relaxation techniques. This can be using breathing and muscle tension release routines. It can also include visualizing peaceful scenes. You can also help control troubling or intruding thoughts by keeping your mind occupied with boring or repetitive thoughts like the old concept of counting sheep. You can make it more creative like imagining planting one beautiful flower after another in your backyard garden.  During your day, work to eliminate stress. When this is not possible use some of the previous suggestions to help reduce the anxiety that accompanies stressful situations. MAKE SURE YOU:   Understand these instructions.  Will watch your condition.  Will get help right away if you are not doing well or get worse.

## 2012-10-10 NOTE — Progress Notes (Signed)
Patient ID: Paula Heath, female   DOB: 10/08/1976, 36 y.o.   MRN: [id number]  HPI: Paula Heath is a 36 y.o. female here to get her physical (with pap smear) and follow up on her hypothyroidism and depression.  Depression: Pt reports she has been doing well. Says that she has had a few episodes of sadness at night, where she will go to sleep but then wakes up and is doing better. Denies SI/HI. Sleeps okay, but wakes up multiple times during the night. She takes her medications at night and gets a headache often after taking her medication.  Panic attacks: Pt reports 3-4 panic attacks in the last 6 weeks. She says her panic attacks used to be triggered by cars, but that these last few have not been caused by any particular trigger. She does not think they are more frequent than previously. She gets chest pain with the panic attacks, that goes away after a few minutes. The chest pain does not occur with exertion.  Gynecology: using implanon. Does not really get periods (very occasional bleeding). Is currently sexually active with a female partner. Is not concerned about STIs. Denies burning, discharge, pain. No abdominal pain or GI upset.  ROS: See HPI  PHYSICAL EXAM: BP 114/80  Pulse 72  Ht 5' 1.5" (1.562 m)  Wt 115 lb (52.164 kg)  BMI 21.38 kg/m2 Gen: NAD, pleasant, cooperative Heart: RRR Lungs: CTAB Neuro: nonfocal, speech intact GU: normal exterior female genitalia, cervix visualized with normal white discharge, no odor, normal bimanual exam without ovarian or uterine enlargement, no cervical motion tenderness Psych: affect normal, no SI/HI

## 2012-10-10 NOTE — Assessment & Plan Note (Addendum)
Pt reports HA after she takes her medications at night. Advised hydration and taking her medications in the morning, which may help her sleep better as well. She can try tylenol too. If they persist, will see her back for more thorough evaluation.

## 2012-10-10 NOTE — Assessment & Plan Note (Signed)
Continue prozac 20mg  daily as above. Offered to increase, but pt prefers to continue this dose, which I think is reasonable. GAD-7 score of 3 (minimal anxiety).

## 2012-10-11 ENCOUNTER — Telehealth: Payer: Self-pay | Admitting: Family Medicine

## 2012-10-11 MED ORDER — LEVOTHYROXINE SODIUM 150 MCG PO TABS: 150.0000 ug | ORAL_TABLET | Freq: Every day | ORAL | Status: DC

## 2012-10-11 NOTE — Telephone Encounter (Signed)
Called pt to give results of TSH testing. TSH still elevated. Will increase synthroid to daily. Pt understands and is amenable with this plan.

## 2012-10-12 ENCOUNTER — Encounter: Payer: Self-pay | Admitting: Family Medicine

## 2012-12-07 ENCOUNTER — Emergency Department (HOSPITAL_BASED_OUTPATIENT_CLINIC_OR_DEPARTMENT_OTHER)
Admission: EM | Admit: 2012-12-07 | Discharge: 2012-12-07 | Disposition: A | Discharge status: Home / Self Care | Payer: Self-pay | Attending: Emergency Medicine | Admitting: Emergency Medicine

## 2012-12-07 ENCOUNTER — Encounter (HOSPITAL_BASED_OUTPATIENT_CLINIC_OR_DEPARTMENT_OTHER): Payer: Self-pay

## 2012-12-07 DIAGNOSIS — Z8709 Personal history of other diseases of the respiratory system: Secondary | ICD-10-CM | POA: Insufficient documentation

## 2012-12-07 DIAGNOSIS — J069 Acute upper respiratory infection, unspecified: Secondary | ICD-10-CM | POA: Insufficient documentation

## 2012-12-07 DIAGNOSIS — Z8711 Personal history of peptic ulcer disease: Secondary | ICD-10-CM | POA: Insufficient documentation

## 2012-12-07 DIAGNOSIS — J40 Bronchitis, not specified as acute or chronic: Secondary | ICD-10-CM | POA: Insufficient documentation

## 2012-12-07 DIAGNOSIS — Z79899 Other long term (current) drug therapy: Secondary | ICD-10-CM | POA: Insufficient documentation

## 2012-12-07 DIAGNOSIS — N926 Irregular menstruation, unspecified: Secondary | ICD-10-CM | POA: Insufficient documentation

## 2012-12-07 DIAGNOSIS — Z862 Personal history of diseases of the blood and blood-forming organs and certain disorders involving the immune mechanism: Secondary | ICD-10-CM | POA: Insufficient documentation

## 2012-12-07 DIAGNOSIS — E079 Disorder of thyroid, unspecified: Secondary | ICD-10-CM | POA: Insufficient documentation

## 2012-12-07 DIAGNOSIS — Z3202 Encounter for pregnancy test, result negative: Secondary | ICD-10-CM | POA: Insufficient documentation

## 2012-12-07 LAB — URINALYSIS, ROUTINE W REFLEX MICROSCOPIC
Bilirubin Urine: NEGATIVE
Hgb urine dipstick: NEGATIVE
Ketones, ur: NEGATIVE mg/dL
Specific Gravity, Urine: 1.026 (ref 1.005–1.030)
pH: 7.5 (ref 5.0–8.0)

## 2012-12-07 MED ORDER — ALBUTEROL SULFATE HFA 108 (90 BASE) MCG/ACT IN AERS
2.0000 | INHALATION_SPRAY | RESPIRATORY_TRACT | Status: DC | PRN
  Administered 2012-12-07: 2 via RESPIRATORY_TRACT
  Filled 2012-12-07: qty 6.7

## 2012-12-07 MED ORDER — ONDANSETRON 4 MG PO TBDP: 4.0000 mg | ORAL_TABLET | Freq: Once | ORAL | Status: DC

## 2012-12-07 NOTE — ED Provider Notes (Signed)
History     CSN: [id number]  Arrival date & time 12/07/12  1129   First MD Initiated Contact with Patient 12/07/12 1148      Chief Complaint  Patient presents with  . Cough    (Consider location/radiation/quality/duration/timing/severity/associated sxs/prior treatment) HPI Pt presents with c/o cough and nasal congestion.  Pt states she has had symptoms over the past 2-3 days.  Today was coughing and coughed up a small amount of blood tinged sputum.  No fever/chills.  Nursing note states she was vomiting- however patient clarifies that she was coughing.  Pt states that the only reason she came to the ED was because her sister was concerned.  She also c/o irregular periods over the past several years.  Has recently started implanon and is interested in getting this out. Not soaking more than 1 pad per hour, no abdominal pain, no fainting.  Denies dysuria.  There are no other associated systemic symptoms, there are no other alleviating or modifying factors.   Past Medical History  Diagnosis Date  . Allergy     seasonal  . Anemia     1998  . Asthma     as a child  . Ulcer     with bleeding around age 47  . Thyroid disease     History reviewed. No pertinent past surgical history.  Family History  Problem Relation Age of Onset  . Diabetes Maternal Grandmother     History  Substance Use Topics  . Smoking status: Never Smoker   . Smokeless tobacco: Not on file  . Alcohol Use: Yes    OB History    Grav Para Term Preterm Abortions TAB SAB Ect Mult Living                  Review of Systems ROS reviewed and all otherwise negative except for mentioned in HPI  Allergies  Benzyl alcohol and Nickel  Home Medications   Current Outpatient Rx  Name  Route  Sig  Dispense  Refill  . FLUOXETINE HCL 20 MG PO CAPS   Oral   Take 1 capsule (20 mg total) by mouth daily.   30 capsule   2   . LEVOTHYROXINE SODIUM 150 MCG PO TABS   Oral   Take 1 tablet (150 mcg total) by mouth  daily.   30 tablet   1     BP 112/70  Pulse 71  Temp 97.9 F (36.6 C) (Oral)  Resp 16  Ht 5\' 1"  (1.549 m)  Wt 115 lb (52.164 kg)  BMI 21.73 kg/m2  SpO2 100%  LMP 11/27/2012 Vitals reviewed Physical Exam Physical Examination: General appearance - alert, well appearing, and in no distress Mental status - alert, oriented to person, place, and time Eyes - no scleral icterus, no conjunctival injection Ears- TMS with small amount of clear fluid behind, no pus/bulging/erythema/decreased landmarks, EACs normal Mouth - mucous membranes moist, pharynx normal without lesions Chest - clear to auscultation, no wheezes, rales or rhonchi, symmetric air entry Heart - normal rate, regular rhythm, normal S1, S2, no murmurs, rubs, clicks or gallops Abdomen - soft, nontender, nondistended, no masses or organomegaly Extremities - peripheral pulses normal, no pedal edema, no clubbing or cyanosis Skin - normal coloration and turgor, no rashes  ED Course  Procedures (including critical care time)   Labs Reviewed  URINALYSIS, ROUTINE W REFLEX MICROSCOPIC  PREGNANCY, URINE   No results found.   1. Bronchitis   2. Upper respiratory infection  MDM  Pt presnting with c/o cough, nasal congestion.  Pt is overall nontoxic and well hydrated in appearance.  Given albuterol MDI for bronchitis in ED and instructed on its use.  Advised to f/u with the clinic that placed the implanon concerning questions about getting it removed.  Pt is not pregnant and has no abdominal pain.  Discharged with strict return precautions.  Pt agreeable with plan.        Ethelda Chick, MD 12/07/12 1226

## 2012-12-07 NOTE — ED Notes (Signed)
Vomited blood x 1 today-irregular periods x 2 months-no period x 3 yrs prior to the last 2 months-pt states she did not have pds due to BCP at that time

## 2012-12-07 NOTE — ED Notes (Signed)
Steady gait to tx area-Pt now states she felt like she coughed up blood-states she was in the shower and started coughing and "spit some stuff out"-denies any large amount of [place] red blood emesis-pt with noted sinus congestion and reporst same

## 2012-12-27 ENCOUNTER — Ambulatory Visit (HOSPITAL_COMMUNITY)
Admission: RE | Admit: 2012-12-27 | Discharge: 2012-12-27 | Disposition: A | Discharge status: Home / Self Care | Payer: BC Managed Care – PPO | Source: Ambulatory Visit | Attending: Family Medicine | Admitting: Family Medicine

## 2012-12-27 ENCOUNTER — Ambulatory Visit (INDEPENDENT_AMBULATORY_CARE_PROVIDER_SITE_OTHER): Payer: BC Managed Care – PPO | Admitting: Family Medicine

## 2012-12-27 VITALS — BP 110/70 | HR 69 | Ht 61.0 in | Wt 125.0 lb

## 2012-12-27 DIAGNOSIS — F32A Depression, unspecified: Secondary | ICD-10-CM

## 2012-12-27 DIAGNOSIS — Z309 Encounter for contraceptive management, unspecified: Secondary | ICD-10-CM

## 2012-12-27 DIAGNOSIS — R0789 Other chest pain: Secondary | ICD-10-CM

## 2012-12-27 DIAGNOSIS — F3289 Other specified depressive episodes: Secondary | ICD-10-CM

## 2012-12-27 DIAGNOSIS — F329 Major depressive disorder, single episode, unspecified: Secondary | ICD-10-CM

## 2012-12-27 DIAGNOSIS — R079 Chest pain, unspecified: Secondary | ICD-10-CM | POA: Insufficient documentation

## 2012-12-27 DIAGNOSIS — E039 Hypothyroidism, unspecified: Secondary | ICD-10-CM

## 2012-12-27 DIAGNOSIS — N920 Excessive and frequent menstruation with regular cycle: Secondary | ICD-10-CM

## 2012-12-27 LAB — CBC
HCT: 36.1 % (ref 36.0–46.0)
Hemoglobin: 12 g/dL (ref 12.0–15.0)
MCV: 82.6 fL (ref 78.0–100.0)
RBC: 4.37 MIL/uL (ref 3.87–5.11)
WBC: 5.1 10*3/uL (ref 4.0–10.5)

## 2012-12-27 LAB — TSH: TSH: 3.83 u[IU]/mL (ref 0.350–4.500)

## 2012-12-27 MED ORDER — FLUOXETINE HCL 20 MG PO CAPS: 20.0000 mg | ORAL_CAPSULE | Freq: Every day | ORAL | Status: DC

## 2012-12-27 NOTE — Progress Notes (Signed)
Patient ID: Paula Heath, female   DOB: 1975/12/21, 37 y.o.   MRN: [id number]  CC: Paula Heath is a 37 y.o. female here to discuss menstrual problems, as well as follow up on her depression and hypothyroidism.   HPI:  Depression: ran out of her prozac 2 weeks ago. Had been taking 20mg  daily. She says she has had some troubles with her mood since then. Before she ran out, was thinking she might need to go up on the dose of her Prozac. Her appetite has gone up. Denies SI/HI. Thinks going back on the prozac is a good idea. Has been fatigued during the day even when she has slept well at night. Has also had a headache for 1 week.   Chest pain: Found on ROS. She has had some chest pain in the center of her chest, even without activity when she is just sitting down. No shortness of breath. Has a hx of panic attacks.  Hypothyroidism: Is taking synthroid every day. Fatigued since running out of prozac, as above.  Heavy periods: when I last saw her for a well woman visit, she said she had not had any significant bleeding but this has since changed. She has had a period about every two weeks. In December it was 10 days long and heavy. In November it was 8 days. She has an implanon that she got in 2012. Prior to that used the mini pill while breastfeeding. She is interested in switching to [place], which she used before and liked. No pain or vaginal discharge. Not concerned about STDs. No lightheadedness, got dizzy for one day but it went away.  ROS: See HPI, otherwise negative.  PHYSICAL EXAM: BP 110/70  Pulse 69  Ht 5\' 1"  (1.549 m)  Wt 125 lb (56.7 kg)  BMI 23.62 kg/m2  LMP 11/27/2012 Gen: NAD, pleasant HEENT: some thyroid fullness, nontender Heart: RRR Lungs: CTAB Neuro: nonfocal, speech intact Abd: soft, nontender, nondistended Psych: normal range of affect, denies SI/HI, speech normal in volume and speed

## 2012-12-27 NOTE — Patient Instructions (Signed)
It was great to see you again today!  We are checking your thyroid level and your blood count today. I will call you with your results if we need to change your thyroid level. I have sent in a refill of your prozac (depression medicine).  Schedule an appointment to come back in two weeks to take out the implanon and insert a Paraguard.  We will check on how your depression is doing at that time too.  If you have any thoughts of hurting yourself or anyone else, please go to the emergency room to stay safe.  Call the clinic if you have any problems.

## 2012-12-28 ENCOUNTER — Telehealth (HOSPITAL_COMMUNITY): Payer: Self-pay | Admitting: Family Medicine

## 2012-12-28 DIAGNOSIS — Z309 Encounter for contraceptive management, unspecified: Secondary | ICD-10-CM | POA: Insufficient documentation

## 2012-12-28 MED ORDER — LEVOTHYROXINE SODIUM 150 MCG PO TABS: 150.0000 ug | ORAL_TABLET | Freq: Every day | ORAL | Status: DC

## 2012-12-28 NOTE — Assessment & Plan Note (Signed)
Currently on synthroid 150 mcg. Check TSH today and titrate as needed.

## 2012-12-28 NOTE — Assessment & Plan Note (Addendum)
Pt endorses chest pain, likely anxiety in etiology. Unlikely to be cardiac given young age, but obtained EKG in clinic today to be on the safe side. Showed NSR, no arrhythmias or signs of ischemia.

## 2012-12-28 NOTE — Assessment & Plan Note (Addendum)
Check CBC today to ensure no severe anemia from menorrhagia. Pt desires to switch from implanon to paraguard due to excessive bleeding. Has had good success with paraguard in the past. Will plan for her to return in 2 weeks to get implanon out & place paraguard.

## 2012-12-28 NOTE — Assessment & Plan Note (Signed)
Out of prozac x2 weeks, with recurrence of mood symptoms, also fatigue. No SI/HI. Will restart prozac 20mg  and f/u on mood in 2 weeks when she returns to get her implanon out & have a paraguard placed.

## 2012-12-28 NOTE — Telephone Encounter (Signed)
Called patient to discuss lab results. TSH was normal, will continue current dose.  CBC also looked good. Will see pt back in 2 weeks to remove implanon & place paraguard.

## 2013-01-10 ENCOUNTER — Ambulatory Visit: Payer: BC Managed Care – PPO | Admitting: Family Medicine

## 2013-01-13 ENCOUNTER — Encounter: Payer: Self-pay | Admitting: Family Medicine

## 2013-01-13 ENCOUNTER — Ambulatory Visit (INDEPENDENT_AMBULATORY_CARE_PROVIDER_SITE_OTHER): Payer: BC Managed Care – PPO | Admitting: Family Medicine

## 2013-01-13 VITALS — Ht 61.0 in | Wt 123.0 lb

## 2013-01-13 DIAGNOSIS — Z309 Encounter for contraceptive management, unspecified: Secondary | ICD-10-CM

## 2013-01-13 DIAGNOSIS — Z30430 Encounter for insertion of intrauterine contraceptive device: Secondary | ICD-10-CM

## 2013-01-13 DIAGNOSIS — Z3046 Encounter for surveillance of implantable subdermal contraceptive: Secondary | ICD-10-CM

## 2013-01-13 DIAGNOSIS — F3289 Other specified depressive episodes: Secondary | ICD-10-CM

## 2013-01-13 DIAGNOSIS — T8332XA Displacement of intrauterine contraceptive device, initial encounter: Secondary | ICD-10-CM | POA: Insufficient documentation

## 2013-01-13 DIAGNOSIS — F32A Depression, unspecified: Secondary | ICD-10-CM

## 2013-01-13 DIAGNOSIS — F329 Major depressive disorder, single episode, unspecified: Secondary | ICD-10-CM

## 2013-01-13 MED ORDER — FLUOXETINE HCL 20 MG PO CAPS: 20.0000 mg | ORAL_CAPSULE | Freq: Every day | ORAL | Status: DC

## 2013-01-13 MED ORDER — PARAGARD INTRAUTERINE COPPER IU IUD
1.0000 | INTRAUTERINE_SYSTEM | Freq: Once | INTRAUTERINE | Status: AC
  Administered 2013-01-13: 1 via INTRAUTERINE

## 2013-01-13 NOTE — Assessment & Plan Note (Signed)
Urine pregnancy negative. Implanon removed today and Paraguard placed. Counseled on importance of returning if she has severe pain, excessive bleeding, fever, or abnormal discharge. Gave handout on post-procedure care.  F/u in 1 month for string check.

## 2013-01-13 NOTE — Assessment & Plan Note (Addendum)
Doing well having restarted prozac 20mg . Will refill today. No signs of hypomania or adverse reaction. She did have some passive SI but was without any intention or plan to harm herself. Discussed importance of going to ER if she has any thoughts of self harm. Pt understands and agrees to this plan. Will f/u at visit in one month.

## 2013-01-13 NOTE — Patient Instructions (Signed)
Today we took out your implanon/nexplanon and placed a Paraguard IUD. You can take a shower or bath after 24 hours from having the implanon removed. The smaller bandage will fall off on its own, about one week.  It is normal to have spotting and cramping for a week after having an IUD placed (see after care instructions below). If you have any fevers, severe pain, severe bleeding, or any other concerns, return to the clinic for a visit. You can always call us with any questions or concerns.  If you have any thoughts of hurting yourself or anyone else, go to the Emergency Room to stay safe. Schedule a return visit in one month so we can recheck your strings and follow up on depression.  Be well, Dr. [name]   Intrauterine Device Insertion Care After Refer to this sheet in the next few weeks. These instructions provide you with information on caring for yourself after your procedure. Your caregiver may also give you more specific instructions. Your treatment has been planned according to current medical practices, but problems sometimes occur. Call your caregiver if you have any problems or questions after your procedure. HOME CARE INSTRUCTIONS   Only take over-the-counter or prescription medicines for pain, discomfort, or fever as directed by your caregiver. Do not use aspirin. This may increase bleeding.  Check your IUD to make sure it is in place before you resume sexual activity. You should be able to feel the strings. If you cannot feel the strings, something may be wrong. The IUD may have fallen out of the uterus, or the uterus may have been punctured (perforated) during placement. Also, if the strings are getting longer, it may mean that the IUD is being forced out of the uterus. You no longer have full protection from pregnancy if any of these problems occur.  You may resume sexual intercourse if you are not having problems with the IUD. The IUD is considered immediately  effective.  You may resume normal activities.  Keep all follow-up appointments to be sure your IUD has remained in place. After the first exam, yearly exams are advised, unless you cannot feel the strings of your IUD.  Continue to check that the IUD is still in place by feeling for the strings after every menstrual period. SEEK MEDICAL CARE IF:   You have bleeding that is heavier or lasts longer than a normal menstrual cycle.  You have a fever.  You have increasing cramps or abdominal pain not relieved with medicine.  You have abdominal pain that does not seem to be related to the same area of earlier cramping and pain.  You are lightheaded, unusually weak, or faint.  You have abnormal vaginal discharge or smells.  You have pain during sexual intercourse.  You cannot feel the IUD strings, or the IUD string has gotten longer.  You feel the IUD at the opening of the cervix in the vagina.  You think you are pregnant, or you miss your menstrual period.  The IUD string is hurting your sex partner. Document Released: 07/29/2011 Document Revised: 02/22/2012 Document Reviewed: 07/29/2011 [facility] Patient Information 2013 [place], [place].

## 2013-01-13 NOTE — Progress Notes (Signed)
Patient ID: Paula Heath, female   DOB: 05/17/1976, 37 y.o.   MRN: [id number]   CC: Paula Heath is a 37 y.o. female here to have implanon removed, Paraguard IUD inserted, and to f/u on depression.   HPI:  Depression: approximately two weeks ago we started her back on Prozac. She had been out of it. She has done well since. The medicine has not kicked in all the way yet but last time it took about one month. No racing thoughts or pressured speech. She is tossing and turning a lot at night. Has felt a little tired. When asked about SI/HI, she endorses thinking one time last week when she was driving "what if I opened the door and fell out". She did not have a plan to do this, it was just a thought. She knows to go to the ER if she has any thoughts of hurting herself or anyone else. No HI.  Periods: periods have been irregular on implanon. Has not had any bleeding so far in January until just today, had some spotting. Desires to get implanon out today and have paraguard IUD. She is aware of the increased risk of irregular bleeding with a Paraguard, but she had one before for about 5 years and did very well on it.  ROS: See HPI  PHYSICAL EXAM: Ht 5\' 1"  (1.549 m)  Wt 123 lb (55.792 kg)  BMI 23.24 kg/m2  LMP 12/08/2012 Gen: NAD GU: cervical os visualized with some bleeding already present pre-IUD insertion.  Psych: affect shows full range. +passive suicidal ideation without plan or intention. No HI.  Neuro: nonfocal, speech intact   Implanon or Nexplanon Removal  Patient given informed consent for removal of her Nexplanon.  Signed copy in the chart.  Time out recorded.  Implanon site identified and able to be palpated.   Area prepped in usual sterile fashon.  Approximately 1-2 cc of 1% lidocaine was used to anesthetize the area at the distal end of the implant. A small stab incision was made near implant on the distal portion.   Scar tissue was carefully sharply dissected using  scalpel and bluntly dissected with hemostat. The implanon rod was grasped using hemostats and removed without difficulty.   Rod measured at 4cm to ensure complete removal.   Steri-strips were applied over the small incision.   A pressure bandage was applied to reduce any bruising.   There was less than 10 cc blood loss. There were no complications.  The patient tolerated the procedure well and was given post procedure instructions.   Paraguard IUD Insertion  Patient given informed consent, signed copy in the chart.  Negative pregnancy confirmed.  Appropriate time out taken.   Sterile instruments and gloves were used. Cervix brought into view with use of speculum and then cleansed three times with betadine swabs.  A tenaculum was placed into the anterior lip of the cervix and a uterine sound was used to measure uterine size, which was 7 cm.  A Paraguard IUD was placed into the endometrial cavity, deployed and secured. The applicator was removed. The strings were trimmed to 3 inches.   There were no complications and the patient tolerated the procedure well.   She was given handouts for post procedure instructions. Pt was also given 400mg  of ibuprofen here in the office for mild cramping.   Each of the above procedures was directly supervised by attending physician Dr. Delbert Harness, who was present for the entirety of each procedure.

## 2013-02-09 ENCOUNTER — Ambulatory Visit: Payer: BC Managed Care – PPO

## 2013-02-17 ENCOUNTER — Ambulatory Visit: Payer: BC Managed Care – PPO | Admitting: Family Medicine

## 2013-02-20 ENCOUNTER — Ambulatory Visit (INDEPENDENT_AMBULATORY_CARE_PROVIDER_SITE_OTHER): Payer: BC Managed Care – PPO | Admitting: Family Medicine

## 2013-02-20 ENCOUNTER — Encounter: Payer: Self-pay | Admitting: Family Medicine

## 2013-02-20 VITALS — BP 115/55 | HR 83 | Temp 98.2°F | Ht 61.0 in | Wt 121.0 lb

## 2013-02-20 DIAGNOSIS — N898 Other specified noninflammatory disorders of vagina: Secondary | ICD-10-CM

## 2013-02-20 DIAGNOSIS — T8332XA Displacement of intrauterine contraceptive device, initial encounter: Secondary | ICD-10-CM

## 2013-02-20 DIAGNOSIS — Z309 Encounter for contraceptive management, unspecified: Secondary | ICD-10-CM

## 2013-02-20 DIAGNOSIS — T8389XA Other specified complication of genitourinary prosthetic devices, implants and grafts, initial encounter: Secondary | ICD-10-CM

## 2013-02-20 LAB — POCT WET PREP (WET MOUNT)
Clue Cells Wet Prep Whiff POC: POSITIVE
WBC, Wet Prep HPF POC: >20

## 2013-02-20 MED ORDER — MEDROXYPROGESTERONE ACETATE 150 MG/ML IM SUSP
150.0000 mg | Freq: Once | INTRAMUSCULAR | Status: AC
  Administered 2013-02-20: 150 mg via INTRAMUSCULAR

## 2013-02-20 NOTE — Assessment & Plan Note (Signed)
A: malpositioned IUD P: Discussed options for malpositioned IUD with Dr. Earnest Bailey. Options were to attempt to push IUD into uterus vs removal. Decided to remove IUD with ring forceps since position is critical for efficacy with paraguard.  Patient with negative U preg today. Patient given depo shot today. Advised patient to call her insurance company about covering replacement IUD.

## 2013-02-20 NOTE — Assessment & Plan Note (Signed)
A: suspect BV given slight order. Possibly physiologic discharge P:  Wet prep collected. Will call patient with results and treat accordingly.

## 2013-02-20 NOTE — Patient Instructions (Addendum)
Ms. Paula Heath,   Thank you for coming in today.  Please call your insurance company, explain that your IUD was malpositioned and had to be removed. This will better ensure that your replacement IUD will be covered.  I will call with wet prep results.  Use condoms during sex until you get replacement birth control.  Dr. Armen Pickup

## 2013-02-20 NOTE — Progress Notes (Signed)
Subjective:     Patient ID: Paula Heath, female   DOB: [birth year], 37 y.o.   MRN: [id number]  HPI 37 yo F presents for same day appt for IUD check. She has paraguard placed on 01/13/13. She reports that 2-3 weeks ago she felt it move out of her cervix. She can feel the strings and the device. She reports that her partner can also feel the device. She LMP was 02/13/13. She denies spotting. She admits to mild mucoid discharge.   Review of Systems As per HPI Denies lower abdominal pain and pain during intercourse    Objective:   Physical Exam BP 115/55  Pulse 83  Temp(Src) 98.2 F (36.8 C) (Oral)  Ht 5\' 1"  (1.549 m)  Wt 121 lb (54.885 kg)  BMI 22.87 kg/m2 General appearance: alert, cooperative and no distress Abd: soft, non tender,  Pelvic: normal external genitalia. White mucoid vaginal discharge. IUD strings visualized from cervical os. The terminal end of the IUD appears to be flushed with the cervical os. On bimanual exam there is no CMT, the terminal end of the IUD is palpable.   IUD removed with ring forceps.      Assessment and Plan:

## 2013-02-21 ENCOUNTER — Telehealth: Payer: Self-pay | Admitting: Family Medicine

## 2013-02-21 MED ORDER — METRONIDAZOLE 500 MG PO TABS: 500.0000 mg | ORAL_TABLET | Freq: Two times a day (BID) | ORAL | Status: DC

## 2013-02-21 NOTE — Telephone Encounter (Signed)
BV on wet prep/ flagyl sent in. Patient called with details.

## 2013-02-22 ENCOUNTER — Telehealth: Payer: Self-pay | Admitting: Family Medicine

## 2013-02-22 DIAGNOSIS — F32A Depression, unspecified: Secondary | ICD-10-CM

## 2013-02-22 DIAGNOSIS — F329 Major depressive disorder, single episode, unspecified: Secondary | ICD-10-CM

## 2013-02-22 MED ORDER — FLUOXETINE HCL 20 MG PO CAPS: 20.0000 mg | ORAL_CAPSULE | Freq: Every day | ORAL | Status: DC

## 2013-02-22 NOTE — Telephone Encounter (Signed)
Called pt to discuss refill request for prozac. She says she is doing well and denies SI/HI. She will schedule a f/u visit with me in clinic to discuss mood in greater detail. I will send in one month's rx for prozac.

## 2013-04-03 ENCOUNTER — Other Ambulatory Visit (HOSPITAL_COMMUNITY): Payer: Self-pay | Admitting: Family Medicine

## 2013-04-04 ENCOUNTER — Telehealth: Payer: Self-pay | Admitting: Family Medicine

## 2013-04-04 NOTE — Telephone Encounter (Signed)
Called patent, she is aware of the refill and is in the process of scheduling an appointment.Temple Ewart, [name]

## 2013-04-04 NOTE — Telephone Encounter (Signed)
To [facility] red team - please call pt and let them know I have refilled one month's worth of their synthroid but they need to schedule an appointment to follow up on their thyroid & depression. Thanks!

## 2013-04-07 ENCOUNTER — Ambulatory Visit (INDEPENDENT_AMBULATORY_CARE_PROVIDER_SITE_OTHER): Payer: BC Managed Care – PPO | Admitting: Family Medicine

## 2013-04-07 ENCOUNTER — Encounter: Payer: Self-pay | Admitting: Family Medicine

## 2013-04-07 VITALS — BP 105/68 | HR 92 | Temp 98.3°F | Ht 61.5 in | Wt 121.5 lb

## 2013-04-07 DIAGNOSIS — R197 Diarrhea, unspecified: Secondary | ICD-10-CM

## 2013-04-07 DIAGNOSIS — R112 Nausea with vomiting, unspecified: Secondary | ICD-10-CM | POA: Insufficient documentation

## 2013-04-07 MED ORDER — ONDANSETRON 4 MG PO TBDP: 4.0000 mg | ORAL_TABLET | Freq: Three times a day (TID) | ORAL | Status: DC | PRN

## 2013-04-07 MED ORDER — PROMETHAZINE HCL 12.5 MG PO TABS
25.0000 mg | ORAL_TABLET | Freq: Once | ORAL | Status: AC
  Administered 2013-04-07: 25 mg via ORAL

## 2013-04-07 NOTE — Patient Instructions (Signed)
Take the Zofran under your tongue every 8 hours for nausea or vomiting.  Make sure you keep drinking water and Gatorade as you have been doing.  The name of the diarrhea medicine is called Immodium.  The cheaper generic is Loperamide.    If you start having high fevers, throwing up and can't keep fluids down, worsening stomach pain, or other concerns come back or head to the ED over the weekend.   I hope you start feeling better soon

## 2013-04-07 NOTE — Assessment & Plan Note (Signed)
Likely viral gastroenteritis. No evidence of hypotension today. Pulse is not tachycardic.  No distress on exam.   She does appear somewhat dry on exam. She was able to tolerate by mouth water here. We also gave her 25 mg of Phenergan here as well. Zofran to treat nausea and vomiting. Imodium over-the-counter to treat diarrhea. Red flags given both verbally and written. Followup Monday or Tuesday next week to ensure improvement.

## 2013-04-07 NOTE — Progress Notes (Signed)
Subjective:    Paula Heath is a 37 y.o. female who presents to [facility] today with complaints of nausea/vomiting/diarrhea:  1.  N/V/D:  Present x 2 days.  Started yesterday AM at 3:30 in morning.  Had multiple episodes of yellowish bile vomitus.  This stopped middle of the day yesterday without any vomiting since then.  However she has continued to have episodes of diarrhea yesterday afternoon this evening. She had 4 or 5 episodes of diarrhea overnight. She describes this as watery stools. No mucus. She one episode of diarrhea this morning before coming to clinic. She is noted small snacks of very faint pinkish blood when she wipes she does have episodes of burning when she wipes after diarrhea. No evidence of dysentery or bloody bowel movements. No melena. Again as above no vomiting today or last night. She is drinking water and Gatorade well. The last thing she edema some crackers yesterday afternoon.  No fevers or chills. No dysuria. 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 Patient is a nonsmoker.    PMH reviewed.  Past Medical History  Diagnosis Date  . Allergy     seasonal  . Anemia     1998  . Asthma     as a child  . Ulcer     with bleeding around age 49  . Thyroid disease    No past surgical history on file.  Medications reviewed. Current Outpatient Prescriptions  Medication Sig Dispense Refill  . FLUoxetine (PROZAC) 20 MG capsule Take 1 capsule (20 mg total) by mouth daily.  30 capsule  0  . levothyroxine (SYNTHROID, LEVOTHROID) 150 MCG tablet TAKE ONE TABLET BY MOUTH EVERY DAY  30 tablet  0  . metroNIDAZOLE (FLAGYL) 500 MG tablet Take 1 tablet (500 mg total) by mouth 2 (two) times daily.  14 tablet  0    No current facility-administered medications for this visit.    ROS as above otherwise neg.  No chest pain, palpitations, SOB, Fever, Chills, Abd pain, N/V/D.   Objective:   Physical Exam BP 105/68  Pulse 92  Temp(Src) 98.3 F (36.8 C) (Oral)  Ht 5' 1.5" (1.562 m)  Wt 121 lb 8 oz (55.112 kg)  BMI 22.59 kg/m2 Gen:  Alert, cooperative patient who appears stated age in no acute distress.  Vital signs reviewed. HEENT: EOMI.  dry mucous membranes Cardiac:  Regular rate and rhythm without murmur auscultated.  Good S1/S2. Pulm:  Clear to auscultation bilaterally with good air movement.  No wheezes or rales noted.   Abd:  Soft/nondistended.  Minimal tenderness left lower quadrant and left upper quadrant. No guarding or rebound. Good bowel sounds throughout. Rectal: No evidence of external hemorrhoids. She did have 2-3 miniscule anal fissures noted which appeared more as irritation than true fissures.  No results found for this or any previous visit (from the past 72 hour(s)).

## 2013-04-20 ENCOUNTER — Telehealth: Payer: Self-pay | Admitting: *Deleted

## 2013-04-20 NOTE — Telephone Encounter (Signed)
Pt reports that her arm was slightly scratched by stray cat while on playground - " It barely grazed my arm". States that she has cleaned area and applied neosporin. Educated about s/s of infection: redness, warmth to area, streaking up arm. Advised if any of these occur to make appointment or seek urgent treatment.No further concerns , pt verbalized understanding. Wyatt Haste, RN-BSN

## 2013-04-27 ENCOUNTER — Ambulatory Visit: Payer: BC Managed Care – PPO

## 2013-04-27 ENCOUNTER — Ambulatory Visit (INDEPENDENT_AMBULATORY_CARE_PROVIDER_SITE_OTHER): Payer: BC Managed Care – PPO | Admitting: Family Medicine

## 2013-04-27 ENCOUNTER — Encounter: Payer: Self-pay | Admitting: Family Medicine

## 2013-04-27 VITALS — BP 106/74 | HR 60 | Ht 61.0 in | Wt 125.0 lb

## 2013-04-27 DIAGNOSIS — Z309 Encounter for contraceptive management, unspecified: Secondary | ICD-10-CM | POA: Insufficient documentation

## 2013-04-27 DIAGNOSIS — F329 Major depressive disorder, single episode, unspecified: Secondary | ICD-10-CM

## 2013-04-27 DIAGNOSIS — F32A Depression, unspecified: Secondary | ICD-10-CM

## 2013-04-27 DIAGNOSIS — J309 Allergic rhinitis, unspecified: Secondary | ICD-10-CM

## 2013-04-27 DIAGNOSIS — E039 Hypothyroidism, unspecified: Secondary | ICD-10-CM

## 2013-04-27 DIAGNOSIS — J302 Other seasonal allergic rhinitis: Secondary | ICD-10-CM | POA: Insufficient documentation

## 2013-04-27 DIAGNOSIS — F3289 Other specified depressive episodes: Secondary | ICD-10-CM

## 2013-04-27 LAB — TSH: TSH: 12.913 u[IU]/mL — ABNORMAL HIGH (ref 0.350–4.500)

## 2013-04-27 MED ORDER — FLUOXETINE HCL 20 MG PO CAPS: 20.0000 mg | ORAL_CAPSULE | Freq: Every day | ORAL | Status: DC

## 2013-04-27 NOTE — Assessment & Plan Note (Signed)
Check TSH today and titrate synthroid as needed. 

## 2013-04-27 NOTE — Progress Notes (Signed)
Name: NEFTALI THUROW Age/Sex: 37 y.o. female  HPI:  Depression: has been out of her medicine since April. Has felt down. Says she had a tough Mother's Day weekend where she laid in bed and cried for a lot of the day. Denies any SI/HI. She wants to restart the prozac as she felt like it helped.  Contraception: got Depo shot on March 10 after her IUD had to be removed due to malposition. Doesn't want to continue with depo. Thinks it has made her gain weight. LMP was in April and lasted longer than 5 days. She thinks she wants a hormone free birth control but hasn't decided yet which kind she wants.  Thyroid: has felt tired the last 2 weeks. Is currently taking synthroid.  Allergies: has been sneezing, had an itchy nose, eyes watering for the past month. Has been taking zyrtec. Says it has helped some.   ROS: See HPI  PMFSH: Reports an aunt died of thyroid disease recently.  PHYSICAL EXAM: BP 106/74  Pulse 60  Ht 5\' 1"  (1.549 m)  Wt 125 lb (56.7 kg)  BMI 23.63 kg/m2  LMP 03/28/2013 Gen: NAD HEENT: MMM, TMs clear bilaterally. No appreciable thyromegaly or nodules. Heart: RRR Lungs: CTAB, NWOB Neuro: grossly nonfocal, speech intact Psych: well groomed. Normal eye contact. Speech normal in rate and volume. Does not appear to be responding to internal stimuli. Thought process logical and linear.

## 2013-04-27 NOTE — Assessment & Plan Note (Addendum)
Has been out of medicine, with return of depressive symptoms. Encouraged patient to call and ask for refills if she is out of her medicine.  Will restart prozac and follow up in 3 weeks. Reminded pt to go to ER if she has any SI/HI.

## 2013-04-27 NOTE — Assessment & Plan Note (Signed)
Gave pt a list of contraception options, for her to review. She is due for a depo shot on June 10, if she wants to continue the depo. I encouraged her to consider continuing it, and that her weight gain might in part be due to currently untreated depression (has been out of medicine). Pt will consider her options and f/u in 3-4 weeks.

## 2013-04-27 NOTE — Patient Instructions (Signed)
It was great to see you again today!  We are checking your thyroid today. I will call you if your test results are not normal.  Otherwise, I will send you a letter.  If you do not hear from me with in 2 weeks please call our office.     I sent in a refill on your depression medicine. If you have any thoughts of hurting yourself or anyone else, go to the Emergency Room to stay safe.  Come back in 3-4 weeks to see how you are doing and talk about birth control options, as you are due for another depo shot on June 10.  For allergies, keep doing the zyrtec daily. You can also try a nasal saline rinse, or a neti pot. You can get these at your local pharmacy.  Be well, Dr. [name]  Contraception Choices Contraception (birth control) is the use of any methods or devices to prevent pregnancy. Below are some methods to help avoid pregnancy. HORMONAL METHODS   Contraceptive implant. This is a thin, plastic tube containing progesterone hormone. It does not contain estrogen hormone. Your caregiver inserts the tube in the inner part of the upper arm. The tube can remain in place for up to 3 years. After 3 years, the implant must be removed. The implant prevents the ovaries from releasing an egg (ovulation), thickens the cervical mucus which prevents sperm from entering the uterus, and thins the lining of the inside of the uterus.  Progesterone-only injections. These injections are given every 3 months by your caregiver to prevent pregnancy. This synthetic progesterone hormone stops the ovaries from releasing eggs. It also thickens cervical mucus and changes the uterine lining. This makes it harder for sperm to survive in the uterus.  Birth control pills. These pills contain estrogen and progesterone hormone. They work by stopping the egg from forming in the ovary (ovulation). Birth control pills are prescribed by a caregiver.Birth control pills can also be used to treat heavy periods.  Minipill. This  type of birth control pill contains only the progesterone hormone. They are taken every day of each month and must be prescribed by your caregiver.  Birth control patch. The patch contains hormones similar to those in birth control pills. It must be changed once a week and is prescribed by a caregiver.  Vaginal ring. The ring contains hormones similar to those in birth control pills. It is left in the vagina for 3 weeks, removed for 1 week, and then a new one is put back in place. The patient must be comfortable inserting and removing the ring from the vagina.A caregiver's prescription is necessary.  Emergency contraception. Emergency contraceptives prevent pregnancy after unprotected sexual intercourse. This pill can be taken right after sex or up to 5 days after unprotected sex. It is most effective the sooner you take the pills after having sexual intercourse. Emergency contraceptive pills are available without a prescription. Check with your pharmacist. Do not use emergency contraception as your only form of birth control. BARRIER METHODS   Female condom. This is a thin sheath (latex or rubber) that is worn over the penis during sexual intercourse. It can be used with spermicide to increase effectiveness.  Female condom. This is a soft, loose-fitting sheath that is put into the vagina before sexual intercourse.  Diaphragm. This is a soft, latex, dome-shaped barrier that must be fitted by a caregiver. It is inserted into the vagina, along with a spermicidal jelly. It is inserted before intercourse. The  diaphragm should be left in the vagina for 6 to 8 hours after intercourse.  Cervical cap. This is a round, soft, latex or plastic cup that fits over the cervix and must be fitted by a caregiver. The cap can be left in place for up to 48 hours after intercourse.  Sponge. This is a soft, circular piece of polyurethane foam. The sponge has spermicide in it. It is inserted into the vagina after wetting  it and before sexual intercourse.  Spermicides. These are chemicals that kill or block sperm from entering the cervix and uterus. They come in the form of creams, jellies, suppositories, foam, or tablets. They do not require a prescription. They are inserted into the vagina with an applicator before having sexual intercourse. The process must be repeated every time you have sexual intercourse. INTRAUTERINE CONTRACEPTION  Intrauterine device (IUD). This is a T-shaped device that is put in a woman's uterus during a menstrual period to prevent pregnancy. There are 2 types:  Copper IUD. This type of IUD is wrapped in copper wire and is placed inside the uterus. Copper makes the uterus and fallopian tubes produce a fluid that kills sperm. It can stay in place for 10 years.  Hormone IUD. This type of IUD contains the hormone progestin (synthetic progesterone). The hormone thickens the cervical mucus and prevents sperm from entering the uterus, and it also thins the uterine lining to prevent implantation of a fertilized egg. The hormone can weaken or kill the sperm that get into the uterus. It can stay in place for 5 years. PERMANENT METHODS OF CONTRACEPTION  Female tubal ligation. This is when the woman's fallopian tubes are surgically sealed, tied, or blocked to prevent the egg from traveling to the uterus.  Female sterilization. This is when the female has the tubes that carry sperm tied off (vasectomy).This blocks sperm from entering the vagina during sexual intercourse. After the procedure, the man can still ejaculate fluid (semen). NATURAL PLANNING METHODS  Natural family planning. This is not having sexual intercourse or using a barrier method (condom, diaphragm, cervical cap) on days the woman could become pregnant.  Calendar method. This is keeping track of the length of each menstrual cycle and identifying when you are fertile.  Ovulation method. This is avoiding sexual intercourse during  ovulation.  Symptothermal method. This is avoiding sexual intercourse during ovulation, using a thermometer and ovulation symptoms.  Post-ovulation method. This is timing sexual intercourse after you have ovulated. Regardless of which type or method of contraception you choose, it is important that you use condoms to protect against the transmission of sexually transmitted diseases (STDs). Talk with your caregiver about which form of contraception is most appropriate for you. Document Released: 11/30/2005 Document Revised: 02/22/2012 Document Reviewed: 04/08/2011 [facility] Patient Information 2013 [place], [place].

## 2013-04-27 NOTE — Assessment & Plan Note (Signed)
Encouraged continued use of zyrtec, as well as nasal saline spray and neti pot.

## 2013-04-28 ENCOUNTER — Telehealth: Payer: Self-pay | Admitting: Family Medicine

## 2013-04-28 MED ORDER — LEVOTHYROXINE SODIUM 175 MCG PO TABS: 175.0000 ug | ORAL_TABLET | Freq: Every day | ORAL | Status: DC

## 2013-04-28 NOTE — Telephone Encounter (Signed)
Called pt to let her know we need to increase her synthroid to 175 mcg (TSH was elevated at 12). Pt understood. Will send in rx. Pt has f/u appt scheduled on 05/25/13.

## 2013-05-19 ENCOUNTER — Emergency Department (HOSPITAL_BASED_OUTPATIENT_CLINIC_OR_DEPARTMENT_OTHER)
Admission: EM | Admit: 2013-05-19 | Discharge: 2013-05-19 | Disposition: A | Discharge status: Home / Self Care | Payer: BC Managed Care – PPO | Attending: Emergency Medicine | Admitting: Emergency Medicine

## 2013-05-19 ENCOUNTER — Encounter (HOSPITAL_BASED_OUTPATIENT_CLINIC_OR_DEPARTMENT_OTHER): Payer: Self-pay | Admitting: *Deleted

## 2013-05-19 DIAGNOSIS — F3289 Other specified depressive episodes: Secondary | ICD-10-CM | POA: Insufficient documentation

## 2013-05-19 DIAGNOSIS — M545 Low back pain, unspecified: Secondary | ICD-10-CM | POA: Insufficient documentation

## 2013-05-19 DIAGNOSIS — F329 Major depressive disorder, single episode, unspecified: Secondary | ICD-10-CM | POA: Insufficient documentation

## 2013-05-19 DIAGNOSIS — J45909 Unspecified asthma, uncomplicated: Secondary | ICD-10-CM | POA: Insufficient documentation

## 2013-05-19 DIAGNOSIS — E039 Hypothyroidism, unspecified: Secondary | ICD-10-CM | POA: Insufficient documentation

## 2013-05-19 DIAGNOSIS — M542 Cervicalgia: Secondary | ICD-10-CM | POA: Insufficient documentation

## 2013-05-19 DIAGNOSIS — Z872 Personal history of diseases of the skin and subcutaneous tissue: Secondary | ICD-10-CM | POA: Insufficient documentation

## 2013-05-19 DIAGNOSIS — Z862 Personal history of diseases of the blood and blood-forming organs and certain disorders involving the immune mechanism: Secondary | ICD-10-CM | POA: Insufficient documentation

## 2013-05-19 DIAGNOSIS — M549 Dorsalgia, unspecified: Secondary | ICD-10-CM

## 2013-05-19 DIAGNOSIS — Z79899 Other long term (current) drug therapy: Secondary | ICD-10-CM | POA: Insufficient documentation

## 2013-05-19 DIAGNOSIS — Z3202 Encounter for pregnancy test, result negative: Secondary | ICD-10-CM | POA: Insufficient documentation

## 2013-05-19 LAB — URINALYSIS, ROUTINE W REFLEX MICROSCOPIC
Bilirubin Urine: NEGATIVE
Glucose, UA: NEGATIVE mg/dL
Nitrite: NEGATIVE
Specific Gravity, Urine: 1.023 (ref 1.005–1.030)
pH: 6.5 (ref 5.0–8.0)

## 2013-05-19 MED ORDER — HYDROCODONE-ACETAMINOPHEN 5-325 MG PO TABS: 2.0000 | ORAL_TABLET | ORAL | Status: DC | PRN

## 2013-05-19 MED ORDER — IBUPROFEN 800 MG PO TABS
800.0000 mg | ORAL_TABLET | Freq: Once | ORAL | Status: AC
  Administered 2013-05-19: 800 mg via ORAL
  Filled 2013-05-19: qty 1

## 2013-05-19 MED ORDER — METHOCARBAMOL 500 MG PO TABS: 500.0000 mg | ORAL_TABLET | Freq: Two times a day (BID) | ORAL | Status: DC

## 2013-05-19 MED ORDER — IBUPROFEN 800 MG PO TABS: 800.0000 mg | ORAL_TABLET | Freq: Three times a day (TID) | ORAL | Status: DC

## 2013-05-19 MED ORDER — HYDROCODONE-ACETAMINOPHEN 5-325 MG PO TABS
2.0000 | ORAL_TABLET | Freq: Once | ORAL | Status: AC
  Administered 2013-05-19: 2 via ORAL
  Filled 2013-05-19: qty 2

## 2013-05-19 NOTE — ED Notes (Signed)
Lower back and neck pain today. No known injury.

## 2013-05-19 NOTE — ED Provider Notes (Signed)
History     CSN: [id number]  Arrival date & time 05/19/13  1925   First MD Initiated Contact with Patient 05/19/13 1941      Chief Complaint  Patient presents with  . Back Pain    (Consider location/radiation/quality/duration/timing/severity/associated sxs/prior treatment) Patient is a 37 y.o. female presenting with back pain. The history is provided by the patient. No language interpreter was used.  Back Pain Location:  Lumbar spine Quality:  Aching Pain severity:  Moderate Pain is:  Worse during the day Duration:  1 day Timing:  Constant Progression:  Worsening Chronicity:  New Relieved by:  Nothing Worsened by:  Nothing tried Ineffective treatments:  None tried Pt complains of soreness in her neck and her back.  No injury  No fever no chills  Past Medical History  Diagnosis Date  . Allergy     seasonal  . Anemia     1998  . Asthma     as a child  . Ulcer     with bleeding around age 72  . Thyroid disease   . Depression 07/01/2012  . Hypothyroidism 08/26/2012    History reviewed. No pertinent past surgical history.  Family History  Problem Relation Age of Onset  . Diabetes Maternal Grandmother   . Thyroid disease      Aunt deceased of some form of thyroid disease in 2014    History  Substance Use Topics  . Smoking status: Never Smoker   . Smokeless tobacco: Not on file  . Alcohol Use: Yes    OB History   Grav Para Term Preterm Abortions TAB SAB Ect Mult Living                  Review of Systems  Musculoskeletal: Positive for back pain.  All other systems reviewed and are negative.    Allergies  Benzyl alcohol and Nickel  Home Medications   Current Outpatient Rx  Name  Route  Sig  Dispense  Refill  . cetirizine (ZYRTEC) 10 MG tablet   Oral   Take 1 tablet (10 mg total) by mouth daily.         Marland Kitchen FLUoxetine (PROZAC) 20 MG capsule   Oral   Take 1 capsule (20 mg total) by mouth daily.   30 capsule   0   . levothyroxine (SYNTHROID)  175 MCG tablet   Oral   Take 1 tablet (175 mcg total) by mouth daily.   30 tablet   1   . Multiple Vitamins-Minerals (MULTIVITAMIN) tablet   Oral   Take 1 tablet by mouth daily.           BP 116/75  Pulse 71  Temp(Src) 98.5 F (36.9 C) (Oral)  Resp 18  Wt 128 lb (58.06 kg)  BMI 24.2 kg/m2  SpO2 100%  LMP 03/28/2013  Physical Exam  Nursing note and vitals reviewed. Constitutional: She is oriented to person, place, and time. She appears well-developed and well-nourished.  HENT:  Head: Normocephalic.  Eyes: EOM are normal.  Neck: Normal range of motion.  Pulmonary/Chest: Effort normal.  Abdominal: Soft. She exhibits no distension.  Musculoskeletal:  Tender cervical and lumbar spine,  Decreased range of motion  Neurological: She is alert and oriented to person, place, and time.  Psychiatric: She has a normal mood and affect.    ED Course  Procedures (including critical care time)  Labs Reviewed  URINALYSIS, ROUTINE W REFLEX MICROSCOPIC  PREGNANCY, URINE   No results found.  1. Back pain       MDM  Pt feels better after ibuprofen and ydrocodone        Elson Areas, PA-C 05/19/13 2254

## 2013-05-20 NOTE — ED Provider Notes (Signed)
Medical screening examination/treatment/procedure(s) were performed by non-physician practitioner and as supervising physician I was immediately available for consultation/collaboration.    Kooper Chriswell J. Fender Herder, MD 05/20/13 1553 

## 2013-05-25 ENCOUNTER — Encounter: Payer: Self-pay | Admitting: Family Medicine

## 2013-05-25 ENCOUNTER — Ambulatory Visit (INDEPENDENT_AMBULATORY_CARE_PROVIDER_SITE_OTHER): Payer: BC Managed Care – PPO | Admitting: Family Medicine

## 2013-05-25 VITALS — BP 118/79 | HR 89 | Ht 61.5 in | Wt 130.0 lb

## 2013-05-25 DIAGNOSIS — E039 Hypothyroidism, unspecified: Secondary | ICD-10-CM

## 2013-05-25 DIAGNOSIS — F32A Depression, unspecified: Secondary | ICD-10-CM

## 2013-05-25 DIAGNOSIS — F3289 Other specified depressive episodes: Secondary | ICD-10-CM

## 2013-05-25 DIAGNOSIS — IMO0001 Reserved for inherently not codable concepts without codable children: Secondary | ICD-10-CM

## 2013-05-25 DIAGNOSIS — Z309 Encounter for contraceptive management, unspecified: Secondary | ICD-10-CM

## 2013-05-25 DIAGNOSIS — F329 Major depressive disorder, single episode, unspecified: Secondary | ICD-10-CM

## 2013-05-25 MED ORDER — ETONOGESTREL-ETHINYL ESTRADIOL 0.12-0.015 MG/24HR VA RING: VAGINAL_RING | VAGINAL | Status: DC

## 2013-05-25 NOTE — Progress Notes (Signed)
Name: Paula Heath Age/Sex: 37 y.o. female  HPI:  Hypothyroidism: has been taking increased dose of synthroid. Says she has been feeling well but has noticed that she has gained weight, which displeases her.  Depression: now taking prozac 20mg  daily. She says that since restarting this after her last visit, she has been feeling better. Has had decreased tearfulness. Denies SI/HI. States that her daughters have told her that she tends to come home and just go in her room, which she states is because she gets tired after work. She does not think she needs to go up on the dose of her prozac, she says she feels all the way better.  Birth control: got depo shot 3 months ago. Thinks she has gained weight from the depo. She doesn't want to get another depo shot today. She has considered her options for birth control and says she wants to do the nuvaring. She has done it before and liked it. Her LMP was in April, she isn't sure of what day.  ROS: See HPI  PMFSH: hx hypothyroidism, depression Denies personal or family history of blood clots. Nonsmoker, no hx of migraine with aura.  PHYSICAL EXAM: BP 118/79  Pulse 89  Ht 5' 1.5" (1.562 m)  Wt 130 lb (58.968 kg)  BMI 24.17 kg/m2  LMP 03/28/2013 Gen: NAD, pleasant and cooperative HEENT: no anterior neck nodules palpable Heart: RRR Lungs: CTAB, NWOB Neuro: grossly nonfocal, speech intact Psych: affect mildly blunted, but patient smiles and laughs at appropriate times. No SI/HI.

## 2013-05-25 NOTE — Patient Instructions (Signed)
It was great to see you again today!  I'm glad you are feeling better. I have given you a prescription for nuvaring. Let me know if you have any problems with it.  Come back in three weeks to have your thyroid retested. Schedule a follow up visit for 1-2 months from now so we can see how you are doing, or sooner if you have any problems.  Be well, Dr. Christene Slates Estradiol; Etonogestrel vaginal ring What is this medicine? ETHINYL ESTRADIOL; ETONOGESTREL (ETH in il es tra DYE ole; et oh noe JES trel) vaginal ring is a flexible, vaginal ring used as a contraceptive (birth control method). This medicine combines two types of female hormones, an estrogen and a progestin. This ring is used to prevent ovulation and pregnancy. Each ring is effective for one month. This medicine may be used for other purposes; ask your health care provider or pharmacist if you have questions. What should I tell my health care provider before I take this medicine? They need to know if you have or ever had any of these conditions: -abnormal vaginal bleeding -blood vessel disease or blood clots -breast, cervical, endometrial, ovarian, liver, or uterine cancer -diabetes -gallbladder disease -heart disease or recent heart attack -high blood pressure -high cholesterol -kidney disease -liver disease -migraine headaches -stroke -systemic lupus erythematosus (SLE) -tobacco smoker -an unusual or allergic reaction to estrogens, progestins, other medicines, foods, dyes, or preservatives -pregnant or trying to get pregnant -breast-feeding How should I use this medicine? Insert the ring into your vagina as directed. Follow the directions on the prescription label. The ring will remain place for 3 weeks and is then removed for a 1-week break. A new ring is inserted 1 week after the last ring was removed, on the same day of the week. Do not use more often than directed. A patient package insert for the product will  be given with each prescription and refill. Read this sheet carefully each time. The sheet may change frequently. Contact your pediatrician regarding the use of this medicine in children. Special care may be needed. This medicine has been used in female children who have started having menstrual periods. Overdosage: If you think you have taken too much of this medicine contact a poison control center or emergency room at once. NOTE: This medicine is only for you. Do not share this medicine with others. What if I miss a dose? You will need to replace your vaginal ring once a month as directed. If the ring should slip out, or if you leave it in longer or shorter than you should, contact your health care professional for advice. What may interact with this medicine? -acetaminophen -antibiotics or medicines for infections, especially rifampin, rifabutin, rifapentine, and griseofulvin, and possibly penicillins or tetracyclines -aprepitant -ascorbic acid (vitamin C) -atorvastatin -barbiturate medicines, such as phenobarbital -bosentan -carbamazepine -caffeine -clofibrate -cyclosporine -dantrolene -doxercalciferol -felbamate -grapefruit juice -hydrocortisone -medicines for anxiety or sleeping problems, such as diazepam or temazepam -medicines for diabetes, including pioglitazone -modafinil -mycophenolate -nefazodone -oxcarbazepine -phenytoin -prednisolone -ritonavir or other medicines for HIV infection or AIDS -rosuvastatin -selegiline -soy isoflavones supplements -St. John's wort -tamoxifen or raloxifene -theophylline -thyroid hormones -topiramate -warfarin This list may not describe all possible interactions. Give your health care provider a list of all the medicines, herbs, non-prescription drugs, or dietary supplements you use. Also tell them if you smoke, drink alcohol, or use illegal drugs. Some items may interact with your medicine. What should I watch for while using this  medicine? Visit your doctor or health care professional for regular checks on your progress. You will need a regular breast and pelvic exam and Pap smear while on this medicine. Use an additional method of contraception during the first cycle that you use this ring. If you have any reason to think you are pregnant, stop using this medicine right away and contact your doctor or health care professional. If you are using this medicine for hormone related problems, it may take several cycles of use to see improvement in your condition. Smoking increases the risk of getting a blood clot or having a stroke while you are using hormonal birth control, especially if you are more than 37 years old. You are strongly advised not to smoke. This medicine can make your body retain fluid, making your fingers, hands, or ankles swell. Your blood pressure can go up. Contact your doctor or health care professional if you feel you are retaining fluid. This medicine can make you more sensitive to the sun. Keep out of the sun. If you cannot avoid being in the sun, wear protective clothing and use sunscreen. Do not use sun lamps or tanning beds/booths. If you wear contact lenses and notice visual changes, or if the lenses begin to feel uncomfortable, consult your eye care specialist. In some women, tenderness, swelling, or minor bleeding of the gums may occur. Notify your dentist if this happens. Brushing and flossing your teeth regularly may help limit this. See your dentist regularly and inform your dentist of the medicines you are taking. If you are going to have elective surgery, you may need to stop using this medicine before the surgery. Consult your health care professional for advice. This medicine does not protect you against HIV infection (AIDS) or any other sexually transmitted diseases. What side effects may I notice from receiving this medicine? Side effects that you should report to your doctor or health care  professional as soon as possible: -breast tissue changes or discharge -changes in vaginal bleeding during your period or between your periods -chest pain -coughing up blood -dizziness or fainting spells -headaches or migraines -leg, arm or groin pain -severe or sudden headaches -stomach pain (severe) -sudden shortness of breath -sudden loss of coordination, especially on one side of the body -speech problems -symptoms of vaginal infection like itching, irritation or unusual discharge -tenderness in the upper abdomen -vomiting -weakness or numbness in the arms or legs, especially on one side of the body -yellowing of the eyes or skin Side effects that usually do not require medical attention (report to your doctor or health care professional if they continue or are bothersome): -breakthrough bleeding and spotting that continues beyond the 3 initial cycles of pills -breast tenderness -mood changes, anxiety, depression, frustration, anger, or emotional outbursts -increased sensitivity to sun or ultraviolet light -nausea -skin rash, acne, or brown spots on the skin -weight gain (slight) This list may not describe all possible side effects. Call your doctor for medical advice about side effects. You may report side effects to FDA at 1-800-FDA-1088. Where should I keep my medicine? Keep out of the reach of children. Store at room temperature between 15 and 30 degrees C (59 and 86 degrees F) for up to 4 months. The product will expire after 4 months. Protect from light. Throw away any unused medicine after the expiration date. NOTE: This sheet is a summary. It may not cover all possible information. If you have questions about this medicine, talk to your doctor, pharmacist,  or health care provider.  2013, [employer]vier/Gold Standard. (11/15/2008 12:03:58 PM)

## 2013-05-29 MED ORDER — FLUOXETINE HCL 20 MG PO CAPS: 20.0000 mg | ORAL_CAPSULE | Freq: Every day | ORAL | Status: DC

## 2013-05-29 NOTE — Assessment & Plan Note (Addendum)
UPT negative in clinic today. Pt desires to start nuvaring. Pt denies hx of blood clots or risk factors for hypercoagulable state. Discussed risk of clots with estrogen, pt understands this risk. Rx for nuvaring given.

## 2013-05-29 NOTE — Assessment & Plan Note (Signed)
Last visit increased synthroid as TSH was still elevated. Currently taking daily. Due for TSH in 3 weeks. Pt will call and schedule lab appointment to have this drawn. Will titrate synthroid as needed based on TSH.

## 2013-05-29 NOTE — Assessment & Plan Note (Signed)
Improved on Prozac. Encouraged pt to consider whether her symptoms are fully controlled, saying that we can go up if needed. No SI/HI. F/u in 1-2 months.

## 2013-07-05 ENCOUNTER — Other Ambulatory Visit: Payer: BC Managed Care – PPO

## 2013-07-07 ENCOUNTER — Telehealth: Payer: Self-pay | Admitting: Family Medicine

## 2013-07-07 NOTE — Telephone Encounter (Signed)
Will fwd to Md.  Obdulio Mash L, CMA  

## 2013-07-07 NOTE — Telephone Encounter (Signed)
Patient is calling after calling her pharmacy Public relations account executive at [facility]) for a refill on her Levothyroxine.  She may have enough to last until Sunday so she will need more for Monday.

## 2013-07-11 MED ORDER — LEVOTHYROXINE SODIUM 175 MCG PO TABS: 175.0000 ug | ORAL_TABLET | Freq: Every day | ORAL | Status: DC

## 2013-07-11 NOTE — Telephone Encounter (Signed)
Please call pt and let her know that I sent in a month's worth of her levothyroxine, but depending on what her TSH shows we may need to change the dose. I see that she has a lab appointment tomorrow.

## 2013-07-12 ENCOUNTER — Other Ambulatory Visit: Payer: BC Managed Care – PPO

## 2013-07-12 DIAGNOSIS — E039 Hypothyroidism, unspecified: Secondary | ICD-10-CM

## 2013-07-12 LAB — TSH: TSH: 0.827 u[IU]/mL (ref 0.350–4.500)

## 2013-07-12 NOTE — Progress Notes (Signed)
Drew TSH   Dewitt Hoes, MLS

## 2013-07-19 ENCOUNTER — Encounter: Payer: Self-pay | Admitting: Family Medicine

## 2013-08-09 ENCOUNTER — Telehealth: Payer: Self-pay | Admitting: Family Medicine

## 2013-08-09 MED ORDER — LEVOTHYROXINE SODIUM 175 MCG PO TABS: 175.0000 ug | ORAL_TABLET | Freq: Every day | ORAL | Status: DC

## 2013-08-09 NOTE — Telephone Encounter (Signed)
Refill requested on thyroid med - advised otc benadryl or allegra for allergies. Wyatt Haste, RN-BSN

## 2013-08-09 NOTE — Telephone Encounter (Signed)
Pt had thyroid check on July 30. She received a letter telling her the results but she did not get a revised prescription. She has no refills on her current medication. Also the OTC allergy medicine zyrtec does not seem to be helping Please advise

## 2013-08-09 NOTE — Telephone Encounter (Signed)
Rx sent in. To [facility] red team - please call pt and let her know I have refilled one month's worth of her thyroid medicine but she needs to schedule an appointment to follow up on her medical problems. Thanks!

## 2013-08-09 NOTE — Telephone Encounter (Signed)
Left voicemail for patient to return call, please read message from Dr [name].[name], [name]

## 2013-08-30 ENCOUNTER — Ambulatory Visit (HOSPITAL_COMMUNITY)
Admission: RE | Admit: 2013-08-30 | Discharge: 2013-08-30 | Disposition: A | Discharge status: Home / Self Care | Payer: BC Managed Care – PPO | Source: Ambulatory Visit | Attending: Family Medicine | Admitting: Family Medicine

## 2013-08-30 ENCOUNTER — Encounter: Payer: Self-pay | Admitting: Family Medicine

## 2013-08-30 ENCOUNTER — Ambulatory Visit (INDEPENDENT_AMBULATORY_CARE_PROVIDER_SITE_OTHER): Payer: BC Managed Care – PPO | Admitting: Family Medicine

## 2013-08-30 VITALS — BP 121/81 | HR 82 | Ht 61.5 in | Wt 136.0 lb

## 2013-08-30 DIAGNOSIS — F3289 Other specified depressive episodes: Secondary | ICD-10-CM

## 2013-08-30 DIAGNOSIS — R0789 Other chest pain: Secondary | ICD-10-CM | POA: Insufficient documentation

## 2013-08-30 DIAGNOSIS — E039 Hypothyroidism, unspecified: Secondary | ICD-10-CM

## 2013-08-30 DIAGNOSIS — F329 Major depressive disorder, single episode, unspecified: Secondary | ICD-10-CM

## 2013-08-30 DIAGNOSIS — F32A Depression, unspecified: Secondary | ICD-10-CM

## 2013-08-30 MED ORDER — FLUOXETINE HCL 20 MG PO CAPS: 20.0000 mg | ORAL_CAPSULE | Freq: Every day | ORAL | Status: DC

## 2013-08-30 NOTE — Progress Notes (Signed)
Patient ID: Paula Heath, female   DOB: 1976-01-07, 37 y.o.   MRN: [id number]   HPI:  Hypothyroidism: Currently taking levothyroxine daily. States she has noticed having hot flashes frequently as of late. Gets sweating, burning sensation in the palms of her hands.  Depression: Says mood has been "all over the meter". Notices she cries on the way to work, sometimes has trouble getting out of bed. Oldest daughter just started working and she feels glad to be home by herself in the afternoons, which is upsetting to her. Feels tired and frequently frustrated. No thoughts of hurting herself or anyone else. She has had about five panic attacks in the last month, which she describes as being mild and manageable. During those episodes she gets a heavy feeling in her chest and has trouble breathing. She is currently taking prozac, although states she has not been completely compliant with the medicine. Did not take it at all this week or over the weekend. States the reason for not taking it, is that she sometimes feels numb with the medicine, like she is not reacting to things the way she should be. The longest she has consistently taken it has been 3 weeks.  Chest discomfort: Also having episodes of chest discomfort/pressure which seem different from the panic attacks. This happens up to twice per day, but not every day. The pain lasts about one minute when it happens and is sharp/pressure-like. She does not feel stressed or anxious in those moments, which is why she thinks it is different from the panic attacks. It's happened about 10-15 times in the last month and seems to be happening more frequently lately. Denies any known family history of cardiac disease. Unsure if the pain is worsened by exertion, because every time it happens, she stops what she's doing to cope with the pain. She is having the pain right now but states it has eased off slightly. Does not get short of breath whenever she is not  having a panic attack (no SOB with these separate episodes of chest discomfort).  ROS: See HPI  PMFSH: hx hypothyroidsim, denies family hx of cardiac illness On Nuvaring for birth control, tolerating this well, LMP was last week  PHYSICAL EXAM: BP 121/81  Pulse 82  Ht 5' 1.5" (1.562 m)  Wt 136 lb (61.689 kg)  BMI 25.28 kg/m2  LMP 08/22/2013 Gen: NAD Heart: RRR Lungs: CTAB, NWOB Neuro: grossly nonfocal, speech intact Psych: affect shows full range, appears anxious/sad at times, normal eye contact, well groomed, speech normal in rate and volume  ASSESSMENT/PLAN:  # see problem based charting -f/u in 1 month

## 2013-08-30 NOTE — Patient Instructions (Addendum)
Come back in 1 month to follow up on the mood. I will call you if your test results are not normal.  Otherwise, I will send you a letter.  If you do not hear from me with in 2 weeks please call our office.     I'll call in a new prescription for your thyroid medicine once we have those results.  If you have any chest pain that does not go away within 30 minutes, is accompanied by nausea, sweating, shortness of breath, or made worse by activity, go to the emergency room immediately for evaluation. Likewise, seek care if you have any shortness of breath.  If you have any thoughts of hurting yourself or anyone else, go to the Emergency Room to stay safe.   Be well, Dr. [name]

## 2013-08-30 NOTE — Assessment & Plan Note (Addendum)
EKG done today, due to increase in frequency of chest pain. Remains completely normal without signs of ischemia. CP was not a presenting complaint today, but was teased out during the history. Pt has very little cardiac risk, given her age, lack of HTN/DM/HLD, no significant family history, nonsmoking status, etc. I suspect this chest discomfort is still related to anxiety/depression and as we get better control of these problems, the chest pain will improve. Counseled regarding return precautions for chest pain, see AVS.

## 2013-08-30 NOTE — Assessment & Plan Note (Addendum)
Not well controlled, but difficult to assess efficacy of medication because she is not taking it regularly. Discussed w/ patient that SSRI's take 4-6 weeks to reach peak effect, and that if she is not taking it consistently, then we are likely not seeing the full effect of the medicine. It seems like she has a moderate component of anxiety coupled with her depressive symptoms. Recommended she take it daily x 1 month and f/u to evaluate symptom control. Pt agreeable to this plan.  I also think she would benefit from counseling, and patient is agreeable to seeing a counselor. Attempted to talk with Dr. Pascal Lux today to see if she is accepting new therapy patients, but she was not in her office. Will plan to address with Dr. Pascal Lux and get back to pt about whether seeing Dr. Pascal Lux is an option, or if she should seek counseling services elsewhere.

## 2013-08-30 NOTE — Assessment & Plan Note (Signed)
Last check of TSH was low normal, so had planned to continue current dose of synthroid (175 mcg daily). However, as she reports hot flashes/heat intolerance, and increase in likely anxiety-related chest discomfort, will check TSH today to see if we are over-supplementing her thyroid medication. Will await results of TSH before refilling thyroid medicine in the event that we need to change her dose.

## 2013-08-31 ENCOUNTER — Other Ambulatory Visit: Payer: Self-pay | Admitting: Family Medicine

## 2013-08-31 ENCOUNTER — Encounter: Payer: Self-pay | Admitting: Family Medicine

## 2013-08-31 LAB — TSH: TSH: 1.701 u[IU]/mL (ref 0.350–4.500)

## 2013-08-31 MED ORDER — LEVOTHYROXINE SODIUM 175 MCG PO TABS: 175.0000 ug | ORAL_TABLET | Freq: Every day | ORAL | Status: DC

## 2013-09-14 ENCOUNTER — Ambulatory Visit (INDEPENDENT_AMBULATORY_CARE_PROVIDER_SITE_OTHER): Payer: BC Managed Care – PPO | Admitting: Emergency Medicine

## 2013-09-14 VITALS — BP 100/60 | HR 60 | Temp 98.6°F | Resp 16 | Ht 62.0 in | Wt 135.2 lb

## 2013-09-14 DIAGNOSIS — Z0000 Encounter for general adult medical examination without abnormal findings: Secondary | ICD-10-CM

## 2013-09-14 NOTE — Progress Notes (Signed)
[facility] [address] [place] [address] [phone]  Date:  09/14/2013   Name:  Paula Heath   DOB:  Jun 19, 1976   MRN:  [id number]  PCP:  Levert Feinstein, MD    Chief Complaint: Employment Physical   History of Present Illness:  Paula Heath is a 37 y.o. very pleasant female patient who presents with the following:  For wellness examination.  No meds.  Up to date on immunizations.  Just starting a teaching job  Patient Active Problem List   Diagnosis Date Noted  . Seasonal allergies 04/27/2013  . Contraception management 04/27/2013  . Nausea vomiting and diarrhea 04/07/2013  . Vaginal discharge 02/20/2013  . Malpositioned IUD 01/13/2013  . Chest discomfort 12/27/2012  . Menorrhagia 12/27/2012  . Panic attacks 10/10/2012  . Headache 10/10/2012  . Hypothyroidism 08/26/2012  . Routine health maintenance 08/26/2012  . Fatigue 07/01/2012  . Depression 07/01/2012    Past Medical History  Diagnosis Date  . Allergy     seasonal  . Anemia     1998  . Asthma     as a child  . Ulcer     with bleeding around age 67  . Thyroid disease   . Depression 07/01/2012  . Hypothyroidism 08/26/2012    History reviewed. No pertinent past surgical history.  History  Substance Use Topics  . Smoking status: Never Smoker   . Smokeless tobacco: Not on file  . Alcohol Use: Yes    Family History  Problem Relation Age of Onset  . Diabetes Maternal Grandmother   . Thyroid disease      Aunt deceased of some form of thyroid disease in 2014    Allergies  Allergen Reactions  . Benzyl Alcohol Swelling  . Nickel     Medication list has been reviewed and updated.  Current Outpatient Prescriptions on File Prior to Visit  Medication Sig Dispense Refill  . etonogestrel-ethinyl estradiol (NUVARING) 0.12-0.015 MG/24HR vaginal ring Insert vaginally and leave in place for 3 consecutive weeks, then remove for 1 week.  1 each  12  . Fexofenadine HCl  (ALLEGRA PO) Take by mouth.      Marland Kitchen FLUoxetine (PROZAC) 20 MG capsule Take 1 capsule (20 mg total) by mouth daily.  30 capsule  0  . levothyroxine (SYNTHROID) 175 MCG tablet Take 1 tablet (175 mcg total) by mouth daily.  30 tablet  2  . Multiple Vitamins-Minerals (MULTIVITAMIN) tablet Take 1 tablet by mouth daily.       No current facility-administered medications on file prior to visit.    Review of Systems:  As per HPI, otherwise negative.    Physical Examination: Filed Vitals:   09/14/13 1514  BP: 100/60  Pulse: 60  Temp: 98.6 F (37 C)  Resp: 16   Filed Vitals:   09/14/13 1514  Height: 5\' 2"  (1.575 m)  Weight: 135 lb 3.2 oz (61.326 kg)   Body mass index is 24.72 kg/(m^2). Ideal Body Weight: Weight in (lb) to have BMI = 25: 136.4  GEN: WDWN, NAD, Non-toxic, A & O x 3 HEENT: Atraumatic, Normocephalic. Neck supple. No masses, No LAD. Ears and Nose: No external deformity. CV: RRR, No M/G/R. No JVD. No thrill. No extra heart sounds. PULM: CTA B, no wheezes, crackles, rhonchi. No retractions. No resp. distress. No accessory muscle use. ABD: S, NT, ND, +BS. No rebound. No HSM. EXTR: No c/c/e NEURO Normal gait.  PSYCH: Normally interactive. Conversant. Not  depressed or anxious appearing.  Calm demeanor.    Assessment and Plan: Wellness examination   Signed,  Phillips Odor, MD

## 2013-09-14 NOTE — Progress Notes (Signed)
  Tuberculosis Risk Questionnaire  1. No Were you born outside the [place] in one of the following parts of the world: [place], [place], [place], [place] or [place]?    2. No Have you traveled outside the [place] and lived for more than one month in one of the following parts of the world: [place], [place], [place], [place] or [place]?    3. No Do you have a compromised immune system such as from any of the following conditions:HIV/AIDS, organ or bone marrow transplantation, diabetes, immunosuppressive medicines (e.g. Prednisone, Remicaide), leukemia, lymphoma, cancer of the head or neck, gastrectomy or jejunal bypass, end-stage renal disease (on dialysis), or silicosis?     4. No Have you ever or do you plan on working in: a residential care center, a health care facility, a jail or prison or homeless shelter?    5. No Have you ever: injected illegal drugs, used crack cocaine, lived in a homeless shelter  or been in jail or prison?     6. No Have you ever been exposed to anyone with infectious tuberculosis?    Tuberculosis Symptom Questionnaire  Do you currently have any of the following symptoms?  1. No Unexplained cough lasting more than 3 weeks?   2. No Unexplained fever lasting more than 3 weeks.   3. No Night Sweats (sweating that leaves the bedclothes and sheets wet)     4. No Shortness of Breath   5. Yes  Chest Pain   6. No Unintentional weight loss    7. No Unexplained fatigue (very tired for no reason)

## 2013-09-19 ENCOUNTER — Telehealth: Payer: Self-pay | Admitting: Family Medicine

## 2013-09-19 NOTE — Telephone Encounter (Signed)
Pt called because her hands are still burning and would like to know if she needs to be re-seen or maybe a different type of medication or increase dosage. JW

## 2013-09-19 NOTE — Telephone Encounter (Signed)
Pt is calling regarding TSH results and her thyroid medication.  Will fwd to MD.   Radene Ou, CMA

## 2013-09-21 NOTE — Telephone Encounter (Signed)
I wrote letter to be sent to patient, but it does not appear to have ever been sent by [facility] admin team. Her TSH was normal. I sent in refills on her thyroid medicine a few weeks ago, she should have this at her pharmacy. If she is still having hand symptoms I recommend she schedule an appointment to be seen to evaluate this. Please call pt and inform. Thanks! [name], MD

## 2013-09-21 NOTE — Telephone Encounter (Signed)
Called pt. LMVM to call back. Please see Dr.McIntyre's message. Thanks. .Mizael Sagar  

## 2013-10-02 ENCOUNTER — Encounter: Payer: Self-pay | Admitting: Family Medicine

## 2013-10-02 ENCOUNTER — Ambulatory Visit (INDEPENDENT_AMBULATORY_CARE_PROVIDER_SITE_OTHER): Payer: BC Managed Care – PPO | Admitting: Family Medicine

## 2013-10-02 VITALS — BP 111/75 | HR 84 | Temp 98.3°F | Ht 62.0 in | Wt 132.0 lb

## 2013-10-02 DIAGNOSIS — R0602 Shortness of breath: Secondary | ICD-10-CM | POA: Insufficient documentation

## 2013-10-02 DIAGNOSIS — R209 Unspecified disturbances of skin sensation: Secondary | ICD-10-CM

## 2013-10-02 DIAGNOSIS — R208 Other disturbances of skin sensation: Secondary | ICD-10-CM

## 2013-10-02 DIAGNOSIS — F32A Depression, unspecified: Secondary | ICD-10-CM

## 2013-10-02 DIAGNOSIS — F3289 Other specified depressive episodes: Secondary | ICD-10-CM

## 2013-10-02 DIAGNOSIS — Z23 Encounter for immunization: Secondary | ICD-10-CM

## 2013-10-02 DIAGNOSIS — F329 Major depressive disorder, single episode, unspecified: Secondary | ICD-10-CM

## 2013-10-02 LAB — IRON AND TIBC
%SAT: 16 % — ABNORMAL LOW (ref 20–55)
Iron: 64 ug/dL (ref 42–145)
UIBC: 347 ug/dL (ref 125–400)

## 2013-10-02 LAB — CBC
Hemoglobin: 12.6 g/dL (ref 12.0–15.0)
Platelets: 341 10*3/uL (ref 150–400)
RBC: 4.49 MIL/uL (ref 3.87–5.11)

## 2013-10-02 MED ORDER — FLUOXETINE HCL 20 MG PO CAPS: 20.0000 mg | ORAL_CAPSULE | Freq: Every day | ORAL | Status: DC

## 2013-10-02 NOTE — Patient Instructions (Signed)
It was great to see you again today!  I'm not sure what's causing the burning in your hands, but it doesn't seem to be anything dangerous. We are checking your blood counts as this could explain the hands and the shortness of breath.   I will call you if your test results are not normal.  Otherwise, I will send you a letter.  If you do not hear from me with in 2 weeks please call our office.  I sent in a refill on the depression medicine. Follow up in 2 months, or sooner if anything changes.  Be well, Dr. [name]

## 2013-10-02 NOTE — Assessment & Plan Note (Signed)
Improved now that patient is taking Prozac every day. Will continue this medicine and followup in 2 months.

## 2013-10-02 NOTE — Assessment & Plan Note (Signed)
Unclear etiology by history. No abnormalities noted on exam. Doubt cardiac or pulmonary etiology given patient's age and relative health. As patient has a history of anemia, will check CBC and iron studies.

## 2013-10-02 NOTE — Progress Notes (Signed)
Patient ID: Paula Heath, female   DOB: Feb 06, 1976, 37 y.o.   MRN: [id number]  HPI:  Shortness of breath: Patient reports being short of breath when singing or walking up or down stairs. She works as a Manufacturing systems engineer, and states that she got short of breath while singing to her students. This happened last week and has happened a few times. It does not occur all the time. She denies having any wheezing or cough. The shortness of breath also occasionally occurs with walking up and down stairs. She has a history of anxiety related chest pain, which she stated is not worse than before, it has actually improved now that she has been taking her medicine every day. The chest pain is not associated with shortness of breath. The shortness of breath has happened about one to 2 times per week. She had asthma as a child. Denies getting short of breath with normal ambulation, such as at the grocery store. She has not been exercising recently. Denies any nasal congestion, or swelling in her legs.  Depression: Patient reports that she's been doing better. She is taking her Prozac every day. She denies any suicidal or homicidal ideation.  Burning in hands: Patient reports having a burning sensation and the palms of both of her hands. The sensation comes and goes, without any known triggers. She does not use a computer very much. She does not have significant repetitive motions of her hands. Denies having any weakness in her hands. She did have to use ice in her hands one time at night in order to sleep.  ROS: See HPI  PMFSH: hx depression and hypothyroidism  PHYSICAL EXAM: BP 111/75  Pulse 84  Temp(Src) 98.3 F (36.8 C) (Oral)  Ht 5\' 2"  (1.575 m)  Wt 132 lb (59.875 kg)  BMI 24.14 kg/m2  LMP 09/18/2013 Gen: NAD HEENT: NCAT Heart: Regular rate and rhythm, no murmurs Lungs: Clear to auscultation bilaterally, no wheezes, no crackles, normal respiratory effort Extremities: Normal appearing hands  bilaterally. 5 out of 5 grip strength bilaterally. No thenar atrophy bilaterally. Negative Tinel's and Phalen's test bilaterally. Neuro: Grossly nonfocal, speech intact Psych: Well-groomed, affect shows full range, normal eye contact, speech normal in rate and volume  ASSESSMENT/PLAN:  # Burning sensation in hands: Unclear etiology. History and physical exam not consistent with carpal tunnel syndrome. Unlikely to be due to a severe problem. Offered reassurance to patient. Will check CBC to ensure iron deficiency anemia noncontributing. If sensation continues to bother patient, may try low-dose gabapentin in the future. Precepted with Dr. Deirdre Priest who agrees with this plan.  # Health maintenance:  -flu shot given today  See problem based charting for additional assessment/plan.  FOLLOW UP: F/u in 2 months for depression and hypothyroidism  [place] J. [name], MD [facility]

## 2013-12-03 ENCOUNTER — Other Ambulatory Visit: Payer: Self-pay | Admitting: Family Medicine

## 2013-12-04 ENCOUNTER — Other Ambulatory Visit: Payer: Self-pay | Admitting: Family Medicine

## 2013-12-04 ENCOUNTER — Telehealth: Payer: Self-pay | Admitting: Family Medicine

## 2013-12-04 NOTE — Telephone Encounter (Signed)
Will fwd to MD to see if new refills maybe sent in.  Delfina Schreurs, Darlyne Russian, CMA

## 2013-12-04 NOTE — Telephone Encounter (Signed)
Rx sent in, please notify pt. Thanks! [name], MD

## 2013-12-04 NOTE — Telephone Encounter (Signed)
Pt just had her prozac and synthroid refilled. But she has lost both of them Please advise

## 2013-12-29 ENCOUNTER — Encounter: Payer: Self-pay | Admitting: Family Medicine

## 2013-12-29 ENCOUNTER — Ambulatory Visit (INDEPENDENT_AMBULATORY_CARE_PROVIDER_SITE_OTHER): Payer: No Typology Code available for payment source | Admitting: Family Medicine

## 2013-12-29 VITALS — BP 112/78 | HR 81 | Temp 98.5°F | Ht 62.0 in | Wt 138.4 lb

## 2013-12-29 DIAGNOSIS — F3289 Other specified depressive episodes: Secondary | ICD-10-CM

## 2013-12-29 DIAGNOSIS — R0602 Shortness of breath: Secondary | ICD-10-CM

## 2013-12-29 DIAGNOSIS — F329 Major depressive disorder, single episode, unspecified: Secondary | ICD-10-CM

## 2013-12-29 DIAGNOSIS — F32A Depression, unspecified: Secondary | ICD-10-CM

## 2013-12-29 MED ORDER — LEVOTHYROXINE SODIUM 175 MCG PO TABS: ORAL_TABLET | ORAL | Status: DC

## 2013-12-29 MED ORDER — ALBUTEROL SULFATE HFA 108 (90 BASE) MCG/ACT IN AERS: 2.0000 | INHALATION_SPRAY | RESPIRATORY_TRACT | Status: DC | PRN

## 2013-12-29 MED ORDER — FLUOXETINE HCL 20 MG PO CAPS: ORAL_CAPSULE | ORAL | Status: DC

## 2013-12-29 NOTE — Progress Notes (Signed)
Patient ID: Paula Heath, female   DOB: 08/04/1976, 38 y.o.   MRN: [id number]  HPI:  Shortness of breath: has been occuring x 1 month. Notices with singing and walking up stairs. It's not present all the time, but has been happening more frequent lately. It's happening most days. Never happens with sitting still, only with exertion. Sleeps on just one pillow. No swelling in legs. No chest pain. Has hx of asthma as a child. No coughing.  Does not feel like she's wheezing. It's a painful feeling like she can't breathe. No recent exercise lately.  Depression: mood has been fine. Sleeping okay. Happy with this dose of prozac, does not think she needs to increase it. Denies SI/HI.  ROS: See HPI  PMFSH: hx hypothyroidism, depression, no hx of smoking  PHYSICAL EXAM: BP 112/78  Pulse 81  Temp(Src) 98.5 F (36.9 C) (Oral)  Ht 5\' 2"  (1.575 m)  Wt 138 lb 6.4 oz (62.778 kg)  BMI 25.31 kg/m2  SpO2 99% Gen: NAD HEENT: NCAT, MMM Heart: RRR, no murmurs Lungs: CTAB, NWOB Neuro: grossly nonfocal, speech intact  ASSESSMENT/PLAN:  See problem based charting for assessment/plan.  FOLLOW UP: F/u in 3-4 weeks for dyspnea. F/u depression in 3 months.  GrenadaBrittany J. Pollie MeyerMcIntyre, MD [facility]

## 2013-12-29 NOTE — Patient Instructions (Signed)
It was great to see you again today!  For depression and thyroid: I refilled your medicines.  For shortness of breath: we are checking a chest xray and an echo (ultrasound of your heart). Please schedule an appointment with Dr. Raymondo BandKoval for pulmonary function tests (this is done here in our clinic). I am also giving you an albuterol inhaler to see if this helps.  Come back to see me in 3-4 weeks. If you get very short of breath or have chest pain, please go to the ER.   Be well, Dr. Pollie MeyerMcIntyre

## 2014-01-03 ENCOUNTER — Telehealth: Payer: Self-pay | Admitting: Family Medicine

## 2014-01-03 NOTE — Assessment & Plan Note (Addendum)
Unclear etiology, no abnormalities on exam. No hypoxia at rest or with ambulation in clinic today. Will check CXR, echo, and have pt return for PFT's with Dr. Raymondo BandKoval. Will also rx albuterol to see if it gives her any symptomatic relief as she has a hx of asthma. F/u in clinic in 3-4 weeks. Precepted with Dr. Lum BabeEniola who agrees with this plan.

## 2014-01-03 NOTE — Telephone Encounter (Signed)
[facility] red team, I saw this pt on Friday and ordered a CXR and echo, but I'm not sure we ever told her where to go in order to get these done. Can you contact her and explain? She probably needs an echo appointment set up as well.  Thanks! Latrelle DodrillBrittany J Aleya Durnell, MD

## 2014-01-03 NOTE — Assessment & Plan Note (Signed)
Well controlled. Continue current dose of prozac. F/u in 3 months.

## 2014-01-04 NOTE — Telephone Encounter (Signed)
LMVM for patient to call me back.  I will schedule echo in the meantime.  Jalisa Sacco, Darlyne RussianKristen L, CMA

## 2014-01-04 NOTE — Telephone Encounter (Signed)
[facility]D [facility] with contrast [facility] for Feb 3,2015 at 10am.  Pt to register admitting through entrance A at 9:45 am.  Pt also needs to have a [facility] done befroehand [facility] at [facility] or [facility].  Tinleigh Whitmire, Darlyne RussianKristen L, CMA

## 2014-01-05 NOTE — Telephone Encounter (Signed)
LMVM on cell phone again requesting patient to please call back.  Please see appt times below.  Shelvie Salsberry, Darlyne RussianKristen L, CMA

## 2014-01-11 NOTE — Telephone Encounter (Signed)
Can we try to call this pt again since it doesn't seem like she has called back? Thanks! Latrelle DodrillBrittany J Maezie Justin, MD

## 2014-01-12 NOTE — Telephone Encounter (Signed)
LMVM for patient.  I stated it was important for her to call us back.  Jidenna Figgs, Darlyne RussianKristen L, CMA

## 2014-01-12 NOTE — Telephone Encounter (Signed)
Called pt and LM with 'female' to call back. Waiting for call back. Please see message. Thanks. Lorenda Hatchet.Tessia Kassin, Renato Battleshekla

## 2014-01-16 ENCOUNTER — Other Ambulatory Visit (HOSPITAL_COMMUNITY): Payer: No Typology Code available for payment source

## 2014-01-30 ENCOUNTER — Ambulatory Visit (HOSPITAL_COMMUNITY): Admission: RE | Admit: 2014-01-30 | Payer: No Typology Code available for payment source | Source: Ambulatory Visit

## 2014-02-05 ENCOUNTER — Encounter: Payer: Self-pay | Admitting: Family Medicine

## 2014-02-05 ENCOUNTER — Ambulatory Visit (HOSPITAL_COMMUNITY)
Admission: RE | Admit: 2014-02-05 | Discharge: 2014-02-05 | Disposition: A | Discharge status: Home / Self Care | Payer: No Typology Code available for payment source | Source: Ambulatory Visit | Attending: Family Medicine | Admitting: Family Medicine

## 2014-02-05 DIAGNOSIS — R0602 Shortness of breath: Secondary | ICD-10-CM

## 2014-02-05 DIAGNOSIS — R079 Chest pain, unspecified: Secondary | ICD-10-CM | POA: Insufficient documentation

## 2014-02-05 DIAGNOSIS — R0989 Other specified symptoms and signs involving the circulatory and respiratory systems: Secondary | ICD-10-CM

## 2014-02-05 DIAGNOSIS — R0609 Other forms of dyspnea: Secondary | ICD-10-CM | POA: Insufficient documentation

## 2014-02-05 DIAGNOSIS — R0789 Other chest pain: Secondary | ICD-10-CM

## 2014-02-05 NOTE — Progress Notes (Signed)
  Echocardiogram 2D Echocardiogram has been performed.  Georgian CoWILLIAMS, Analycia Khokhar 02/05/2014, 8:25 AM

## 2014-02-05 NOTE — Progress Notes (Deleted)
*  PRELIMINARY RESULTS* Echocardiogram 2D Echocardiogram has been performed.  Georgian CoWILLIAMS, Carolie Mcilrath 02/05/2014, 8:23 AM

## 2014-03-12 ENCOUNTER — Telehealth: Payer: Self-pay | Admitting: Family Medicine

## 2014-03-12 NOTE — Telephone Encounter (Signed)
Patient has been taking Zyrtec and  Allegra for a while now and they have become ineffective. Patient would like to know if there is something else she can take. She states she is miserable right now. Please call cell, if she does not answer please call her at work at [phone].

## 2014-03-12 NOTE — Telephone Encounter (Signed)
Pt stated she has water, itchy eyes, runny nose, sneezing. Pt has taken Zyrtec and Allegra and no relief.  Pt asked if she tried Clartin, but medication is not effective.  Pt told to try OTC nasal spray.  If no relief, she would need to schedule an appt with a provider.  Pt stated understanding.  Precepted with Dr. Mauricio PoBreen, he agrees with the suggestion.  Clovis PuMartin, Tamika L, RN

## 2014-04-16 ENCOUNTER — Ambulatory Visit: Payer: No Typology Code available for payment source | Admitting: Family Medicine

## 2014-05-16 ENCOUNTER — Encounter: Payer: Self-pay | Admitting: Family Medicine

## 2014-05-16 ENCOUNTER — Ambulatory Visit (INDEPENDENT_AMBULATORY_CARE_PROVIDER_SITE_OTHER): Payer: No Typology Code available for payment source | Admitting: Family Medicine

## 2014-05-16 VITALS — BP 107/74 | HR 75 | Ht 62.0 in | Wt 142.0 lb

## 2014-05-16 DIAGNOSIS — E039 Hypothyroidism, unspecified: Secondary | ICD-10-CM

## 2014-05-16 DIAGNOSIS — F3289 Other specified depressive episodes: Secondary | ICD-10-CM

## 2014-05-16 DIAGNOSIS — F329 Major depressive disorder, single episode, unspecified: Secondary | ICD-10-CM

## 2014-05-16 DIAGNOSIS — F32A Depression, unspecified: Secondary | ICD-10-CM

## 2014-05-16 DIAGNOSIS — Z309 Encounter for contraceptive management, unspecified: Secondary | ICD-10-CM

## 2014-05-16 MED ORDER — FLUOXETINE HCL 40 MG PO CAPS: ORAL_CAPSULE | ORAL | Status: DC

## 2014-05-16 MED ORDER — ETONOGESTREL-ETHINYL ESTRADIOL 0.12-0.015 MG/24HR VA RING: VAGINAL_RING | VAGINAL | Status: DC

## 2014-05-16 NOTE — Progress Notes (Signed)
Patient ID: Paula Heath, female   DOB: 1976-11-06, 38 y.o.   MRN: [id number]  HPI:  Contraception: Needs refill of nuvaring. Has heard of problems with people having cancer from it, and has questions about that. Otherwise she likes it. She is currently using her last ring. LMP last week.  Depression: Pt currently taking fluoxetine 20mg  daily. She complains of multiple additional symptoms, including trouble focusing, memory issues, trouble sleeping, racing thoughts, having it hard to get out of bed. Says that some days her mood is fine, other days it's really down. Denies any SI/HI.  Thyroid: currently on of synthroid daily. C/o above sx's, aslo c/o burning palms of her hands and hot and cold flashes. Also says her skin has been burning in the sun more easily lately.   ROS: See HPI  PMFSH: hx hypothyroidism, depression/anxiety, seasonal allergies  PHYSICAL EXAM: BP 107/74  Pulse 75  Ht 5\' 2"  (1.575 m)  Wt 142 lb (64.411 kg)  BMI 25.97 kg/m2  LMP 05/07/2014 Gen: NAD HEENT: NCAT, fullness of thyroid gland present Heart: RRR, no murmurs rubs or gallops Lungs: CTAB, NWOB Abdomen: soft, nontender to palpation Neuro: grossly nonfocal, speech normal Psych: normal range of affect, well groomed, speech normal in rate and volume, normal eye contact   ASSESSMENT/PLAN:  See problem based charting for additional assessment/plan.  FOLLOW UP: F/u in 3-4 weeks for depression.  [place] J. [name], MD [facility]

## 2014-05-16 NOTE — Patient Instructions (Addendum)
It was great to see you again today!  We are checking your thyroid level today. I will call you if we need to make any changes.  I think the symptoms you've been having are from depression. We are increasing your dose of fluoxetine to 40mg  daily.  I refilled your nuvaring for another year.  Follow up in 3-4 weeks to see if you're feeling any better on the increased dose of prozac.  Be well, Dr. [name]

## 2014-05-17 LAB — VITAMIN B12: VITAMIN B 12: 263 pg/mL (ref 211–911)

## 2014-05-17 LAB — TSH: TSH: 0.608 u[IU]/mL (ref 0.350–4.500)

## 2014-05-20 NOTE — Assessment & Plan Note (Signed)
Satisfied with nuvaring. She would like to stay on this. We reviewed the risks and benefits of estrogen containing birth control. She would like to continue nuvaring. Refilled for one years worth.

## 2014-05-20 NOTE — Assessment & Plan Note (Signed)
Many of the sx's pt is reporting are likely related to uncontrolled depression (memory, fatigue, sleep, etc). Will increase prozac to 40mg  daily and have pt f/u in 3-4 weeks to monitor for response.

## 2014-05-20 NOTE — Assessment & Plan Note (Addendum)
Pt reports sx's possibly suggestive of uncontrolled thyroid disease. Will check TSH today and titrate synthroid as needed. Will also check vitamin B12 level as pt c/o burning in her hands (we did not have time to fully address that problem today).

## 2014-05-24 ENCOUNTER — Encounter: Payer: Self-pay | Admitting: Family Medicine

## 2014-06-13 ENCOUNTER — Ambulatory Visit: Payer: No Typology Code available for payment source | Admitting: Family Medicine

## 2014-06-18 ENCOUNTER — Other Ambulatory Visit: Payer: Self-pay | Admitting: Family Medicine

## 2014-08-26 ENCOUNTER — Emergency Department (HOSPITAL_COMMUNITY)
Admission: EM | Admit: 2014-08-26 | Discharge: 2014-08-26 | Disposition: A | Discharge status: Home / Self Care | Payer: No Typology Code available for payment source | Attending: Emergency Medicine | Admitting: Emergency Medicine

## 2014-08-26 ENCOUNTER — Encounter (HOSPITAL_COMMUNITY): Payer: Self-pay | Admitting: Emergency Medicine

## 2014-08-26 DIAGNOSIS — L03319 Cellulitis of trunk, unspecified: Secondary | ICD-10-CM | POA: Diagnosis present

## 2014-08-26 DIAGNOSIS — R51 Headache: Secondary | ICD-10-CM | POA: Insufficient documentation

## 2014-08-26 DIAGNOSIS — Z79899 Other long term (current) drug therapy: Secondary | ICD-10-CM | POA: Diagnosis not present

## 2014-08-26 DIAGNOSIS — L02219 Cutaneous abscess of trunk, unspecified: Secondary | ICD-10-CM | POA: Diagnosis present

## 2014-08-26 DIAGNOSIS — J45901 Unspecified asthma with (acute) exacerbation: Secondary | ICD-10-CM | POA: Insufficient documentation

## 2014-08-26 DIAGNOSIS — F329 Major depressive disorder, single episode, unspecified: Secondary | ICD-10-CM | POA: Diagnosis not present

## 2014-08-26 DIAGNOSIS — F3289 Other specified depressive episodes: Secondary | ICD-10-CM | POA: Diagnosis not present

## 2014-08-26 DIAGNOSIS — L0390 Cellulitis, unspecified: Secondary | ICD-10-CM

## 2014-08-26 DIAGNOSIS — N764 Abscess of vulva: Secondary | ICD-10-CM | POA: Diagnosis not present

## 2014-08-26 DIAGNOSIS — Z862 Personal history of diseases of the blood and blood-forming organs and certain disorders involving the immune mechanism: Secondary | ICD-10-CM | POA: Diagnosis not present

## 2014-08-26 DIAGNOSIS — L0291 Cutaneous abscess, unspecified: Secondary | ICD-10-CM

## 2014-08-26 DIAGNOSIS — E039 Hypothyroidism, unspecified: Secondary | ICD-10-CM | POA: Insufficient documentation

## 2014-08-26 DIAGNOSIS — R6883 Chills (without fever): Secondary | ICD-10-CM | POA: Diagnosis not present

## 2014-08-26 DIAGNOSIS — R000 Tachycardia, unspecified: Secondary | ICD-10-CM | POA: Diagnosis not present

## 2014-08-26 MED ORDER — SODIUM BICARBONATE 4 % IV SOLN
5.0000 mL | Freq: Once | INTRAVENOUS | Status: AC
  Administered 2014-08-26: 5 mL via SUBCUTANEOUS
  Filled 2014-08-26: qty 5

## 2014-08-26 MED ORDER — HYDROMORPHONE HCL PF 1 MG/ML IJ SOLN
0.5000 mg | Freq: Once | INTRAMUSCULAR | Status: AC
  Administered 2014-08-26: 0.5 mg via INTRAMUSCULAR

## 2014-08-26 MED ORDER — HYDROMORPHONE HCL PF 1 MG/ML IJ SOLN
0.5000 mg | Freq: Once | INTRAMUSCULAR | Status: DC
  Filled 2014-08-26: qty 1

## 2014-08-26 MED ORDER — SULFAMETHOXAZOLE-TMP DS 800-160 MG PO TABS: 1.0000 | ORAL_TABLET | Freq: Three times a day (TID) | ORAL | Status: DC

## 2014-08-26 MED ORDER — OXYCODONE-ACETAMINOPHEN 5-325 MG PO TABS: 1.0000 | ORAL_TABLET | Freq: Four times a day (QID) | ORAL | Status: DC | PRN

## 2014-08-26 MED ORDER — LIDOCAINE HCL (PF) 1 % IJ SOLN
5.0000 mL | Freq: Once | INTRAMUSCULAR | Status: AC
  Administered 2014-08-26: 5 mL
  Filled 2014-08-26: qty 5

## 2014-08-26 MED ORDER — CEPHALEXIN 500 MG PO CAPS: 500.0000 mg | ORAL_CAPSULE | Freq: Four times a day (QID) | ORAL | Status: DC

## 2014-08-26 MED ORDER — HYDROMORPHONE HCL PF 1 MG/ML IJ SOLN: 1.0000 mg | Freq: Once | INTRAMUSCULAR | Status: DC

## 2014-08-26 NOTE — ED Notes (Signed)
thge pt reports that she has had a boil in her rt groin since yesterday??  Painful and difficulty walking

## 2014-08-26 NOTE — ED Notes (Signed)
Pt c/o abscess on right side of vagina.  Sts small amount of drainage.  C/O being very painful

## 2014-08-26 NOTE — Discharge Instructions (Signed)

## 2014-08-26 NOTE — ED Provider Notes (Signed)
CSN: [id number]     Arrival date & time 08/26/14  1521 History   First MD Initiated Contact with Patient 08/26/14 1612     Chief Complaint  Patient presents with  . Abscess     (Consider location/radiation/quality/duration/timing/severity/associated sxs/prior Treatment) Patient is a 38 y.o. female presenting with abscess. The history is provided by the patient.  Abscess Associated symptoms: headaches   Associated symptoms: no fever    patient has had an abscess in her right groin. It began 2 days ago. She states yesterday she thought was going to get better. She states it is more swollen today. She states she's had some chills. No fevers. She's had a headache. No nausea vomiting. There's been some purulent drainage.  Past Medical History  Diagnosis Date  . Allergy     seasonal  . Anemia     1998  . Asthma     as a child  . Ulcer     with bleeding around age 38  . Thyroid disease   . Depression 07/01/2012  . Hypothyroidism 08/26/2012   History reviewed. No pertinent past surgical history. Family History  Problem Relation Age of Onset  . Diabetes Maternal Grandmother   . Thyroid disease      Aunt deceased of some form of thyroid disease in 2014   History  Substance Use Topics  . Smoking status: Never Smoker   . Smokeless tobacco: Not on file  . Alcohol Use: Yes   OB History   Grav Para Term Preterm Abortions TAB SAB Ect Mult Living                 Review of Systems  Constitutional: Positive for chills. Negative for fever.  Respiratory: Negative for shortness of breath.   Cardiovascular: Negative for chest pain.  Gastrointestinal: Negative for abdominal pain.  Genitourinary: Negative for vaginal discharge and vaginal pain.  Musculoskeletal: Negative for joint swelling.  Skin: Positive for wound.  Neurological: Positive for headaches.      Allergies  Benzyl alcohol and Nickel  Home Medications   Prior to Admission medications   Medication Sig Start Date  End Date Taking? Authorizing Provider  cetirizine (ZYRTEC) 10 MG tablet Take 10 mg by mouth daily.   Yes Historical Provider, MD  etonogestrel-ethinyl estradiol (NUVARING) 0.12-0.015 MG/24HR vaginal ring Place 1 each vaginally every 28 (twenty-eight) days. Insert vaginally and leave in place for 3 consecutive weeks, then remove for 1 week.   Yes Historical Provider, MD  FLUoxetine (PROZAC) 40 MG capsule Take 40 mg by mouth daily.   Yes Historical Provider, MD  levothyroxine (SYNTHROID, LEVOTHROID) 175 MCG tablet Take 175 mcg by mouth daily before breakfast.   Yes Historical Provider, MD  cephALEXin (KEFLEX) 500 MG capsule Take 1 capsule (500 mg total) by mouth 4 (four) times daily. 08/26/14   Juliet Rude. Landrie Beale, MD  oxyCODONE-acetaminophen (PERCOCET/ROXICET) 5-325 MG per tablet Take 1-2 tablets by mouth every 6 (six) hours as needed for severe pain. 08/26/14   Juliet Rude. Arnetha Silverthorne, MD  sulfamethoxazole-trimethoprim (BACTRIM DS) 800-160 MG per tablet Take 1 tablet by mouth 3 (three) times daily. 08/26/14   Juliet Rude. Shawnte Winton, MD   BP 120/67  Pulse 91  Temp(Src) 98.8 F (37.1 C)  Resp 18  Ht  (1.575 m)  Wt 141 lb (63.957 kg)  BMI 25.78 kg/m2  SpO2 99%  LMP 07/26/2014 Physical Exam  Constitutional: She appears well-developed.  Cardiovascular:  Mild tachycardia  Pulmonary/Chest: Effort normal.  Abdominal: Soft.  There is no tenderness.  Skin:  Erythema and swelling of her right labia majora area. Toward the middle of it and laterally there is some purulent drainage. There is swelling and mass is approximately 5 cm x 2 and half centimeters.    ED Course  INCISION AND DRAINAGE Date/Time: 08/26/2014 8:22 PM Performed by: Benjiman Core R. Authorized by: Billee Cashing Consent: Verbal consent obtained. written consent not obtained. Risks and benefits: risks, benefits and alternatives were discussed Consent given by: patient Patient understanding: patient states understanding of  the procedure being performed Test results: test results available and properly labeled Required items: required blood products, implants, devices, and special equipment available Patient identity confirmed: verbally with patient Time out: Immediately prior to procedure a "time out" was called to verify the correct patient, procedure, equipment, support staff and site/side marked as required. Type: abscess Body area: anogenital Location details: vulva Anesthesia: local infiltration Local anesthetic: lidocaine 1% without epinephrine and NaHCO3 (sodium bicarbonate) Anesthetic total: 5 ml Patient sedated: no Risk factor: underlying major vessel and  underlying major nerve Scalpel size: 11 Incision type: single straight Complexity: simple Drainage: purulent Drainage amount: scant Wound treatment: wound left open and  drain placed Packing material: 1/4 in iodoform gauze Patient tolerance: Patient tolerated the procedure well with no immediate complications.   (including critical care time) Labs Review Labs Reviewed - No data to display  Imaging Review No results found.   EKG Interpretation None      MDM   Final diagnoses:  Abscess and cellulitis    Patient with right labial abscess. Incision and drainage done but only had small amount of pus. Will give antibiotics. Wound is packed. We will have followup in 2 days for wound recheck. Return for worsening of symptoms.    Juliet Rude. Rubin Payor, MD 08/26/14 [phone]

## 2014-08-27 ENCOUNTER — Other Ambulatory Visit: Payer: Self-pay | Admitting: Family Medicine

## 2014-08-28 ENCOUNTER — Emergency Department (HOSPITAL_COMMUNITY)
Admission: EM | Admit: 2014-08-28 | Discharge: 2014-08-28 | Discharge status: Against Medical Advice | Payer: No Typology Code available for payment source | Attending: Emergency Medicine | Admitting: Emergency Medicine

## 2014-08-28 ENCOUNTER — Encounter (HOSPITAL_COMMUNITY): Payer: Self-pay | Admitting: Emergency Medicine

## 2014-08-28 DIAGNOSIS — J45909 Unspecified asthma, uncomplicated: Secondary | ICD-10-CM | POA: Diagnosis not present

## 2014-08-28 DIAGNOSIS — Z4889 Encounter for other specified surgical aftercare: Secondary | ICD-10-CM | POA: Insufficient documentation

## 2014-08-28 NOTE — ED Notes (Signed)
Pt sts she was seen on Sunday for an abscess, had it drained but not all would come out. Pt was told to come back to follow up in 2 days so she is here for that. Pt reports the abscess is on her vagina. Denies worsening symptoms but sts that she is still having some swelling. Nad, skin warm and dry, resp e/u.

## 2014-09-03 ENCOUNTER — Ambulatory Visit (INDEPENDENT_AMBULATORY_CARE_PROVIDER_SITE_OTHER): Payer: No Typology Code available for payment source | Admitting: Family Medicine

## 2014-09-03 ENCOUNTER — Encounter: Payer: Self-pay | Admitting: Family Medicine

## 2014-09-03 VITALS — BP 116/70 | HR 79 | Temp 98.5°F | Resp 18 | Wt 145.0 lb

## 2014-09-03 DIAGNOSIS — L270 Generalized skin eruption due to drugs and medicaments taken internally: Secondary | ICD-10-CM | POA: Insufficient documentation

## 2014-09-03 MED ORDER — PREDNISONE 50 MG PO TABS: 50.0000 mg | ORAL_TABLET | Freq: Every day | ORAL | Status: DC

## 2014-09-03 NOTE — Patient Instructions (Signed)
Prednisone tablets What is this medicine? PREDNISONE (PRED ni sone) is a corticosteroid. It is commonly used to treat inflammation of the skin, joints, lungs, and other organs. Common conditions treated include asthma, allergies, and arthritis. It is also used for other conditions, such as blood disorders and diseases of the adrenal glands. This medicine may be used for other purposes; ask your health care provider or pharmacist if you have questions. COMMON BRAND NAME(S): Deltasone, Predone, Sterapred, Sterapred DS What should I tell my health care provider before I take this medicine? They need to know if you have any of these conditions: -Cushing's syndrome -diabetes -glaucoma -heart disease -high blood pressure -infection (especially a virus infection such as chickenpox, cold sores, or herpes) -kidney disease -liver disease -mental illness -myasthenia gravis -osteoporosis -seizures -stomach or intestine problems -thyroid disease -an unusual or allergic reaction to lactose, prednisone, other medicines, foods, dyes, or preservatives -pregnant or trying to get pregnant -breast-feeding How should I use this medicine? Take this medicine by mouth with a glass of water. Follow the directions on the prescription label. Take this medicine with food. If you are taking this medicine once a day, take it in the morning. Do not take more medicine than you are told to take. Do not suddenly stop taking your medicine because you may develop a severe reaction. Your doctor will tell you how much medicine to take. If your doctor wants you to stop the medicine, the dose may be slowly lowered over time to avoid any side effects. Talk to your pediatrician regarding the use of this medicine in children. Special care may be needed. Overdosage: If you think you have taken too much of this medicine contact a poison control center or emergency room at once. NOTE: This medicine is only for you. Do not share this  medicine with others. What if I miss a dose? If you miss a dose, take it as soon as you can. If it is almost time for your next dose, talk to your doctor or health care professional. You may need to miss a dose or take an extra dose. Do not take double or extra doses without advice. What may interact with this medicine? Do not take this medicine with any of the following medications: -metyrapone -mifepristone This medicine may also interact with the following medications: -aminoglutethimide -amphotericin B -aspirin and aspirin-like medicines -barbiturates -certain medicines for diabetes, like glipizide or glyburide -cholestyramine -cholinesterase inhibitors -cyclosporine -digoxin -diuretics -ephedrine -female hormones, like estrogens and birth control pills -isoniazid -ketoconazole -NSAIDS, medicines for pain and inflammation, like ibuprofen or naproxen -phenytoin -rifampin -toxoids -vaccines -warfarin This list may not describe all possible interactions. Give your health care provider a list of all the medicines, herbs, non-prescription drugs, or dietary supplements you use. Also tell them if you smoke, drink alcohol, or use illegal drugs. Some items may interact with your medicine. What should I watch for while using this medicine? Visit your doctor or health care professional for regular checks on your progress. If you are taking this medicine over a prolonged period, carry an identification card with your name and address, the type and dose of your medicine, and your doctor's name and address. This medicine may increase your risk of getting an infection. Tell your doctor or health care professional if you are around anyone with measles or chickenpox, or if you develop sores or blisters that do not heal properly. If you are going to have surgery, tell your doctor or health care professional that  you have taken this medicine within the last twelve months. Ask your doctor or health  care professional about your diet. You may need to lower the amount of salt you eat. This medicine may affect blood sugar levels. If you have diabetes, check with your doctor or health care professional before you change your diet or the dose of your diabetic medicine. What side effects may I notice from receiving this medicine? Side effects that you should report to your doctor or health care professional as soon as possible: -allergic reactions like skin rash, itching or hives, swelling of the face, lips, or tongue -changes in emotions or moods -changes in vision -depressed mood -eye pain -fever or chills, cough, sore throat, pain or difficulty passing urine -increased thirst -swelling of ankles, feet Side effects that usually do not require medical attention (report to your doctor or health care professional if they continue or are bothersome): -confusion, excitement, restlessness -headache -nausea, vomiting -skin problems, acne, thin and shiny skin -trouble sleeping -weight gain This list may not describe all possible side effects. Call your doctor for medical advice about side effects. You may report side effects to FDA at 1-800-FDA-1088. Where should I keep my medicine? Keep out of the reach of children. Store at room temperature between 15 and 30 degrees C (59 and 86 degrees F). Protect from light. Keep container tightly closed. Throw away any unused medicine after the expiration date. NOTE: This sheet is a summary. It may not cover all possible information. If you have questions about this medicine, talk to your doctor, pharmacist, or health care provider.  2015, [employer] Standard. (2011-07-16 10:57:14)  Avascular Necrosis Avascular necrosis is a disease resulting from the temporary or permanent loss of the blood supply to the bones. Without blood, the bone tissue dies and causes the bone to become soft. If the process involves the bone near a joint, it may lead to collapse  of the joint surface. This disease is also known as:  Osteonecrosis.  Aseptic necrosis.  Ischemic bone necrosis. Avascular necrosis most commonly affects the ends (epiphysis) of long bones. The femur, the bone extending from the knee joint to the hip joint, is the bone most commonly involved. The disease may affect 1 bone, more than 1 bone at the same time, more than 1 bone at different times. It affects men and women equally. Avascular necrosis occurs at any age. But it is more common between the ages of 32 and 50 years. SYMPTOMS  In early stages patients may not have any symptoms. But as the disease progresses, joint pain generally develops. At first there is pain when putting weight on the affected joint, and then when resting. Pain usually develops gradually. It may be mild or severe. As the disease progresses and the bone and surrounding joint surface collapses, pain may develop or increase dramatically. Pain may be severe enough to limit range of motion in the affected joint. The period of time between the first symptoms and loss of joint function is different for each patient. This can range from several months to more than a year. Disability depends on:  What part of the bone is affected.  How large an area is involved.  How effectively the bone repairs itself.  If other illnesses are present.  If you are being treated for cancer with medications (chemotherapy).  Radiation.  The cause of the avascular necrosis. DIAGNOSIS  The diagnosis of aseptic necrosis is usually made by:  Taking a history.  Doing  an exam.  Taking X-rays. (If X-rays are normal, an MRI may be required.)  Sometimes further blood work and specialized studies may be necessary. TREATMENT  Treatment for this disease is necessary to maintain joint function. If untreated, most patients will suffer severe pain and limitation in movement within 2 years. Several treatments are available that help prevent further  bone and joint damage. They can also reduce pain. To determine the most appropriate treatment, the caregiver considers the following aspects of a patient's disease:  The age of the patient.  The stage of the disease (early or late).  The location and amount of bone affected. It may be a small or large area.  The underlying cause of avascular necrosis. The goals in treatment are to:  Improve the patient's use of the affected joint.  Stop further damage to the bone.  Improve bone and joint survival. Your caregiver may use one or more of the following treatments:  Reduced weight bearing. If avascular necrosis is diagnosed early, the caregiver may begin treatment by having the patient limit weight on the affected joint. The caregiver may recommend limiting activities or using crutches. In some cases, reduced weight bearing can slow the damage caused by the disease and permit natural healing. When combined with medication to reduce pain, reduced weight bearing can be an effective way to avoid or delay surgery for some patients. Most patients eventually will need surgery to reconstruct the joint.  Core decompression. Core decompression works best in people who are in the earliest stages of avascular necrosis, before the collapse of the joint. This procedure often can reduce pain and slow the progression of bone and joint destruction in these patients. This surgical procedure removes the inner layer of bone, which:  Reduces pressure within the bone.  Increases blood flow to the bone.  Allows more blood vessels to form.  Reduces pain.  Osteotomy. This surgical procedure re-shapes the bone to reduce stress on the affected area of the joint. There is a lengthy recovery period. The patient's activities are very limited for 3 to 12 months after an osteotomy. This procedure is most effective for younger patients with advanced avascular necrosis, and those with a large area of affected bone.  Bone  Graft. A bone graft may be used to support a joint after core decompression. Bone grafting is surgery that transplants healthy bone from one part of the patient, such as the leg, to the diseased area. Sometimes the bone is taken with it's blood vessels which are attached to local blood vessels near the area of bone collapse. This is called a vascularized bone graft. There is a lengthy recovery period after a bone graft, usually from 6 to 12 months. This procedure is technically complex.  Arthroplasty. Arthroplasty is also known as total joint replacement. Total joint replacement is used in late-stage avascular necrosis, and when the joint is deformed. In this surgery, the diseased joint is replaced with artificial parts. It may be recommended for people who are not good candidates for other treatments, such as patients who may not do well with repeated attempts to preserve the joint. Various types of replacements are available, and patients should discuss specific needs with their caregiver. New treatments being tried include:  The use of medications.  Electrical stimulation.  Combination therapies to increase the growth of new bone and blood vessels. Document Released: 05/22/2002 Document Revised: 02/22/2012 Document Reviewed: 02/07/2014 [facility] Patient Information 2015 [place], [place]. This information is not intended to replace advice  given to you by your health care provider. Make sure you discuss any questions you have with your health care provider.

## 2014-09-03 NOTE — Assessment & Plan Note (Signed)
Morbilliform rash c/w drug eruption from either Bactrim or Keflex.  Severe pruritis at this point w/o mucous membrane involvement.  Has to go to work so does not want to take anything sedating.  Will start on Prednisone 50 mg per day x 5 days and stop her ABx (one day left of keflex, finished Bactrim).  F/U if MM involvement or develops fever/systemic symptoms.

## 2014-09-03 NOTE — Progress Notes (Signed)
   Subjective:    Patient ID: Paula Heath, female    DOB: January 06, 1976, 38 y.o.   MRN: [id number]  CC: Rash HPI Rash that began on the chest and proximal extremities not involving the hands or feet or mouth/mucous membranes earlier today.  Extremely pruritic and denies any recent changes in soaps/detergents/travel/deodorant or recent bedding changes or sleeping in a hotel.  She has not tried anything for this and she denies any fever, chills, sweats, or mouth/eye involvement.  She has never had this before but does state she started taking Keflex/Bactrim about one week ago for a "boil" Rx from the ED.  Unsure if she has been on either of these medications before or has had reaction like this from a medication before.     Review of Systems     Objective:   Physical Exam Gen: NAD Skin: generalized morbiliform maculopapular rash involving chest, arms, legs but not soles of feet/hands       Assessment & Plan:

## 2014-10-12 ENCOUNTER — Telehealth: Payer: Self-pay | Admitting: Family Medicine

## 2014-10-12 ENCOUNTER — Other Ambulatory Visit: Payer: Self-pay | Admitting: Family Medicine

## 2014-10-12 NOTE — Telephone Encounter (Signed)
To [facility] red team - please call pt and let her know I have refilled her prozac but she needs to schedule an appointment to follow up on her medical problems. Thanks!  Vicy Medico J Dempsey Ahonen, MD   

## 2014-10-15 ENCOUNTER — Telehealth: Payer: Self-pay | Admitting: Family Medicine

## 2014-10-15 NOTE — Telephone Encounter (Signed)
Pt called to schedule a f/u appointment, but Dr. Pollie MeyerMcIntyre does not have anything for November and the December schedule is not up yet. Pt will call back next week when the schedule is posted. jw

## 2014-10-15 NOTE — Telephone Encounter (Signed)
LM[facility] informing pt of rx being sent in and to call back and make a follow up appt. Kendarius Vigen CMA

## 2014-11-06 ENCOUNTER — Encounter (HOSPITAL_COMMUNITY): Payer: Self-pay | Admitting: Emergency Medicine

## 2014-11-06 ENCOUNTER — Emergency Department (HOSPITAL_COMMUNITY)
Admission: EM | Admit: 2014-11-06 | Discharge: 2014-11-06 | Disposition: A | Discharge status: Home / Self Care | Payer: No Typology Code available for payment source | Attending: Emergency Medicine | Admitting: Emergency Medicine

## 2014-11-06 DIAGNOSIS — J45909 Unspecified asthma, uncomplicated: Secondary | ICD-10-CM | POA: Diagnosis not present

## 2014-11-06 DIAGNOSIS — Z792 Long term (current) use of antibiotics: Secondary | ICD-10-CM | POA: Diagnosis not present

## 2014-11-06 DIAGNOSIS — H9203 Otalgia, bilateral: Secondary | ICD-10-CM | POA: Diagnosis not present

## 2014-11-06 DIAGNOSIS — Z862 Personal history of diseases of the blood and blood-forming organs and certain disorders involving the immune mechanism: Secondary | ICD-10-CM | POA: Diagnosis not present

## 2014-11-06 DIAGNOSIS — E079 Disorder of thyroid, unspecified: Secondary | ICD-10-CM | POA: Insufficient documentation

## 2014-11-06 DIAGNOSIS — F329 Major depressive disorder, single episode, unspecified: Secondary | ICD-10-CM | POA: Insufficient documentation

## 2014-11-06 DIAGNOSIS — J3489 Other specified disorders of nose and nasal sinuses: Secondary | ICD-10-CM

## 2014-11-06 DIAGNOSIS — J069 Acute upper respiratory infection, unspecified: Secondary | ICD-10-CM | POA: Insufficient documentation

## 2014-11-06 DIAGNOSIS — Z79899 Other long term (current) drug therapy: Secondary | ICD-10-CM | POA: Diagnosis not present

## 2014-11-06 DIAGNOSIS — Z872 Personal history of diseases of the skin and subcutaneous tissue: Secondary | ICD-10-CM | POA: Diagnosis not present

## 2014-11-06 DIAGNOSIS — Z7952 Long term (current) use of systemic steroids: Secondary | ICD-10-CM | POA: Diagnosis not present

## 2014-11-06 MED ORDER — IBUPROFEN 400 MG PO TABS
800.0000 mg | ORAL_TABLET | Freq: Once | ORAL | Status: AC
  Administered 2014-11-06: 800 mg via ORAL
  Filled 2014-11-06: qty 4

## 2014-11-06 MED ORDER — CICLESONIDE 50 MCG/ACT NA SUSP: 2.0000 | Freq: Every day | NASAL | Status: DC

## 2014-11-06 MED ORDER — OXYMETAZOLINE HCL 0.05 % NA SOLN
1.0000 | Freq: Once | NASAL | Status: AC
  Administered 2014-11-06: 1 via NASAL
  Filled 2014-11-06: qty 15

## 2014-11-06 NOTE — Discharge Instructions (Signed)
1. Medications: Omnaris or Flonase, Mucinex, usual home medications including Zyrtec 2. Treatment: rest, drink plenty of fluids, if you are unable to find a nasal steroid you may use over-the-counter nasal decongestant 3. Follow Up: Please followup with your primary doctor in 3 days for discussion of your diagnoses and further evaluation after today's visit; if you do not have a primary care doctor use the resource guide provided to find one; Please return to the ER for worsening pain, fevers or other concerning symptoms  Otalgia The most common reason for this in children is an infection of the middle ear. Pain from the middle ear is usually caused by a build-up of fluid and pressure behind the eardrum. Pain from an earache can be sharp, dull, or burning. The pain may be temporary or constant. The middle ear is connected to the nasal passages by a short narrow tube called the Eustachian tube. The Eustachian tube allows fluid to drain out of the middle ear, and helps keep the pressure in your ear equalized. CAUSES  A cold or allergy can block the Eustachian tube with inflammation and the build-up of secretions. This is especially likely in small children, because their Eustachian tube is shorter and more horizontal. When the Eustachian tube closes, the normal flow of fluid from the middle ear is stopped. Fluid can accumulate and cause stuffiness, pain, hearing loss, and an ear infection if germs start growing in this area. SYMPTOMS  The symptoms of an ear infection may include fever, ear pain, fussiness, increased crying, and irritability. Many children will have temporary and minor hearing loss during and right after an ear infection. Permanent hearing loss is rare, but the risk increases the more infections a child has. Other causes of ear pain include retained water in the outer ear canal from swimming and bathing. Ear pain in adults is less likely to be from an ear infection. Ear pain may be referred  from other locations. Referred pain may be from the joint between your jaw and the skull. It may also come from a tooth problem or problems in the neck. Other causes of ear pain include:  A foreign body in the ear.  Outer ear infection.  Sinus infections.  Impacted ear wax.  Ear injury.  Arthritis of the jaw or TMJ problems.  Middle ear infection.  Tooth infections.  Sore throat with pain to the ears. DIAGNOSIS  Your caregiver can usually make the diagnosis by examining you. Sometimes other special studies, including x-rays and lab work may be necessary. TREATMENT   If antibiotics were prescribed, use them as directed and finish them even if you or your child's symptoms seem to be improved.  Sometimes PE tubes are needed in children. These are little plastic tubes which are put into the eardrum during a simple surgical procedure. They allow fluid to drain easier and allow the pressure in the middle ear to equalize. This helps relieve the ear pain caused by pressure changes. HOME CARE INSTRUCTIONS   Only take over-the-counter or prescription medicines for pain, discomfort, or fever as directed by your caregiver. DO NOT GIVE CHILDREN ASPIRIN because of the association of Reye's Syndrome in children taking aspirin.  Use a cold pack applied to the outer ear for 15-20 minutes, 03-04 times per day or as needed may reduce pain. Do not apply ice directly to the skin. You may cause frost bite.  Over-the-counter ear drops used as directed may be effective. Your caregiver may sometimes prescribe ear drops.  Resting in an upright position may help reduce pressure in the middle ear and relieve pain.  Ear pain caused by rapidly descending from high altitudes can be relieved by swallowing or chewing gum. Allowing infants to suck on a bottle during airplane travel can help.  Do not smoke in the house or near children. If you are unable to quit smoking, smoke outside.  Control allergies. SEEK  IMMEDIATE MEDICAL CARE IF:   You or your child are becoming sicker.  Pain or fever relief is not obtained with medicine.  You or your child's symptoms (pain, fever, or irritability) do not improve within 24 to 48 hours or as instructed.  Severe pain suddenly stops hurting. This may indicate a ruptured eardrum.  You or your children develop new problems such as severe headaches, stiff neck, difficulty swallowing, or swelling of the face or around the ear. Document Released: 07/17/2004 Document Revised: 02/22/2012 Document Reviewed: 11/21/2008 [facility] Patient Information 2015 [place]. This information is not intended to replace advice given to you by your health care provider. Make sure you discuss any questions you have with your health care provider.

## 2014-11-06 NOTE — ED Provider Notes (Signed)
CSN: [id number]     Arrival date & time 11/06/14  1918 History  This chart was scribed for non-physician practitioner, Dierdre ForthHannah Bairon Klemann, PA-C, working with Elwin MochaBlair Walden, MD, by Bronson CurbJacqueline Melvin, ED Scribe. This patient was seen in room TR06C/TR06C and the patient's care was started at 8:27 PM.   Chief Complaint  Patient presents with  . Otalgia    The history is provided by the patient and medical records. No language interpreter was used.     HPI Comments: Paula Heath is a 38 y.o. female who presents to the Emergency Department complaining of gradually worsening, bilateral ear pain onset last week. Patient reports history of ear infections and suspects this is related. There is associated rhinorrhea, dry cough and congestion. She notes sick contacts at work. Patient reports taking vitamins, but denies taking any decongestants. She reports relief when placing a finger in her ears. She also reports taking Zyrtec every night for relief of allergies. Patient denies drainage, hearing loss, fever, or chills. She is currently on levothyroxine and an antidepressant medication.  Patient reports significant history of seasonal allergies for which she has taken Flonase in the past but does not like this medication because of its postnasal drip.  He has not attempted any over-the-counter decongestants. She denies hearing loss.    Past Medical History  Diagnosis Date  . Allergy     seasonal  . Anemia     1998  . Asthma     as a child  . Ulcer     with bleeding around age 89  . Thyroid disease   . Depression 07/01/2012  . Hypothyroidism 08/26/2012   History reviewed. No pertinent past surgical history. Family History  Problem Relation Age of Onset  . Diabetes Maternal Grandmother   . Thyroid disease      Aunt deceased of some form of thyroid disease in 2014   History  Substance Use Topics  . Smoking status: Never Smoker   . Smokeless tobacco: Not on file  . Alcohol Use: Yes   OB  History    No data available     Review of Systems  Constitutional: Negative for fever, chills, appetite change and fatigue.  HENT: Positive for congestion, ear pain (bilateral), postnasal drip, rhinorrhea, sinus pressure and sore throat. Negative for ear discharge, hearing loss and mouth sores.   Eyes: Negative for visual disturbance.  Respiratory: Positive for cough. Negative for chest tightness, shortness of breath, wheezing and stridor.   Cardiovascular: Negative for chest pain, palpitations and leg swelling.  Gastrointestinal: Negative for nausea, vomiting, abdominal pain and diarrhea.  Genitourinary: Negative for dysuria, urgency, frequency and hematuria.  Musculoskeletal: Negative for myalgias, back pain, arthralgias and neck stiffness.  Skin: Negative for rash.  Neurological: Negative for syncope, light-headedness, numbness and headaches.  Hematological: Negative for adenopathy.  Psychiatric/Behavioral: The patient is not nervous/anxious.   All other systems reviewed and are negative.     Allergies  Benzyl alcohol and Nickel  Home Medications   Prior to Admission medications   Medication Sig Start Date End Date Taking? Authorizing Provider  cephALEXin (KEFLEX) 500 MG capsule Take 1 capsule (500 mg total) by mouth 4 (four) times daily. 08/26/14   Juliet RudeNathan R. Pickering, MD  cetirizine (ZYRTEC) 10 MG tablet Take 10 mg by mouth daily.    Historical Provider, MD  ciclesonide (OMNARIS) 50 MCG/ACT nasal spray Place 2 sprays into both nostrils daily. 11/06/14   Dahlia ClientHannah Totiana Everson, PA-C  etonogestrel-ethinyl estradiol (NUVARING) 0.12-0.015 MG/24HR  vaginal ring Place 1 each vaginally every 28 (twenty-eight) days. Insert vaginally and leave in place for 3 consecutive weeks, then remove for 1 week.    Historical Provider, MD  FLUoxetine (PROZAC) 40 MG capsule TAKE ONE CAPSULE BY MOUTH ONCE DAILY 10/12/14   [name], MD  levothyroxine (SYNTHROID, LEVOTHROID) 175 MCG tablet Take  175 mcg by mouth daily before breakfast.    Historical Provider, MD  oxyCODONE-acetaminophen (PERCOCET/ROXICET) 5-325 MG per tablet Take 1-2 tablets by mouth every 6 (six) hours as needed for severe pain. 08/26/14   Juliet Rude. Rubin Payor, MD  predniSONE (DELTASONE) 50 MG tablet Take 1 tablet (50 mg total) by mouth daily with breakfast. 09/03/14   Briscoe Deutscher, DO  sulfamethoxazole-trimethoprim (BACTRIM DS) 800-160 MG per tablet Take 1 tablet by mouth 3 (three) times daily. 08/26/14   Juliet Rude. Rubin Payor, MD   Triage Vitals: BP 120/66 mmHg  Pulse 87  Temp(Src) 98.3 F (36.8 C) (Oral)  Resp 16  Ht 5\' 1"  (1.549 m)  Wt 143 lb (64.864 kg)  BMI 27.03 kg/m2  SpO2 97%  LMP 10/15/2014  Physical Exam  Constitutional: She is oriented to person, place, and time. She appears well-developed and well-nourished. No distress.  HENT:  Head: Normocephalic and atraumatic.  Right Ear: Tympanic membrane, external ear and ear canal normal.  Left Ear: Tympanic membrane, external ear and ear canal normal.  Nose: Mucosal edema and rhinorrhea present. No epistaxis. Right sinus exhibits no maxillary sinus tenderness and no frontal sinus tenderness. Left sinus exhibits no maxillary sinus tenderness and no frontal sinus tenderness.  Mouth/Throat: Uvula is midline and mucous membranes are normal. Mucous membranes are not pale and not cyanotic. No oropharyngeal exudate, posterior oropharyngeal edema, posterior oropharyngeal erythema or tonsillar abscesses.  Clear fluid noted behind bilateral tympanic membrane.  Eyes: Conjunctivae are normal. Pupils are equal, round, and reactive to light.  Neck: Normal range of motion and full passive range of motion without pain.  Cardiovascular: Normal rate, normal heart sounds and intact distal pulses.   No murmur heard. Pulmonary/Chest: Effort normal and breath sounds normal. No stridor. No respiratory distress.  Clear and equal breath sounds without focal wheezes, rhonchi, rales   Abdominal: Soft. Bowel sounds are normal. There is no tenderness.  Musculoskeletal: Normal range of motion.  Lymphadenopathy:    She has no cervical adenopathy.  Neurological: She is alert and oriented to person, place, and time.  Skin: Skin is warm and dry. No rash noted. She is not diaphoretic.  Psychiatric: She has a normal mood and affect.  Nursing note and vitals reviewed.   ED Course  Procedures (including critical care time)  DIAGNOSTIC STUDIES: Oxygen Saturation is 97% on room air, adequate by my interpretation.    COORDINATION OF CARE: At 2032 Discussed treatment plan with patient. Patient agrees.   Labs Review Labs Reviewed - No data to display  Imaging Review No results found.   EKG Interpretation None      MDM   Final diagnoses:  Viral URI  Otalgia, bilateral  Rhinorrhea   Snow N Denomme presents with bilateral otalgia. On exam patient with clear fluid behind the TMs bilaterally without erythema, bulging or evidence of otitis media.  Edematous nasal turbinates but no tenderness to palpation of the sinuses.  Discussed need for nasal decongestion and my preference for nasal spray versus oral decongestants. Also recommend usage of Mucinex.  Patient started taking Zyrtec and should continue. Will give ibuprofen for pain and she may  take this at home.  I have personally reviewed patient's vitals, nursing note and any pertinent labs or imaging.  I performed an focused physical exam; undressed when appropriate .    It has been determined that no acute conditions requiring further emergency intervention are present at this time. The patient/guardian have been advised of the diagnosis and plan. I reviewed any labs and imaging including any potential incidental findings. We have discussed signs and symptoms that warrant return to the ED and they are listed in the discharge instructions.    Vital signs are stable at discharge.   BP 120/66 mmHg  Pulse 87   Temp(Src) 98.3 F (36.8 C) (Oral)  Resp 16  Ht 5\' 1"  (1.549 m)  Wt 143 lb (64.864 kg)  BMI 27.03 kg/m2  SpO2 97%  LMP 10/15/2014  I personally performed the services described in this documentation, which was scribed in my presence. The recorded information has been reviewed and is accurate.   Dahlia ClientHannah Mekhi Sonn, PA-C 11/06/14 2101  Elwin MochaBlair Walden, MD 11/06/14 [phone]

## 2014-11-06 NOTE — ED Notes (Signed)
Pt. reports bilateral ear ache onset last week with no drainage , denies fever or chills, no hearing loss.

## 2014-11-15 ENCOUNTER — Other Ambulatory Visit: Payer: Self-pay | Admitting: Family Medicine

## 2014-11-16 NOTE — Telephone Encounter (Signed)
Covering for Dr Pollie MeyerMcIntyre. Please call patient to inform her I will refill both synthroid and prozac for 1 month but she needs to set up appt to follow up medical issues with Dr Pollie MeyerMcIntyre within this month. No appt seen on record.  FYI, last filled Jan 2015 for 4 months. At f/u, would wonder if she been taking this medication daily since Jan, since rx history suggests she has not.  Leona SingletonMaria T Timber Lucarelli, MD

## 2014-11-16 NOTE — Telephone Encounter (Signed)
Pt walked into clinic today with questions regarding medication refills.  Pt informed that synthroid and prozac was sent into [facility] today for 1 month and for her to follow with PCP.  Appt scheduled 12/11/2014 at 8:45 with PCP.  Clovis PuMartin, Bhavana Kady L, RN

## 2014-12-11 ENCOUNTER — Ambulatory Visit: Payer: No Typology Code available for payment source | Admitting: Family Medicine

## 2014-12-11 ENCOUNTER — Other Ambulatory Visit: Payer: Self-pay | Admitting: Family Medicine

## 2014-12-11 MED ORDER — LEVOTHYROXINE SODIUM 175 MCG PO TABS: 175.0000 ug | ORAL_TABLET | Freq: Every day | ORAL | Status: DC

## 2014-12-11 MED ORDER — FLUOXETINE HCL 40 MG PO CAPS: 40.0000 mg | ORAL_CAPSULE | Freq: Every day | ORAL | Status: DC

## 2014-12-11 NOTE — Telephone Encounter (Signed)
Pt was supposed top have an appointment today 12/29 but the doctor is out. She was coming in for a refill, can we send in a refill of her medications. jw

## 2014-12-11 NOTE — Telephone Encounter (Signed)
Please inform patient that I have sent in a refill on her medication (synthroid, prozac). Please also apologize on my behalf that I was unable to see her today for her appointment.  Thanks, Latrelle DodrillBrittany J Colene Mines, MD

## 2014-12-12 NOTE — Telephone Encounter (Signed)
Pt informed. Caitriona Sundquist, CMA  

## 2015-01-08 ENCOUNTER — Ambulatory Visit: Payer: No Typology Code available for payment source | Admitting: Family Medicine

## 2015-02-20 ENCOUNTER — Ambulatory Visit: Payer: No Typology Code available for payment source | Admitting: Family Medicine

## 2015-03-22 ENCOUNTER — Ambulatory Visit (INDEPENDENT_AMBULATORY_CARE_PROVIDER_SITE_OTHER): Payer: 59 | Admitting: Family Medicine

## 2015-03-22 ENCOUNTER — Encounter: Payer: Self-pay | Admitting: Family Medicine

## 2015-03-22 VITALS — BP 109/88 | HR 82 | Temp 98.6°F | Ht 61.0 in | Wt 139.0 lb

## 2015-03-22 DIAGNOSIS — F329 Major depressive disorder, single episode, unspecified: Secondary | ICD-10-CM | POA: Diagnosis not present

## 2015-03-22 DIAGNOSIS — F32A Depression, unspecified: Secondary | ICD-10-CM

## 2015-03-22 DIAGNOSIS — R32 Unspecified urinary incontinence: Secondary | ICD-10-CM

## 2015-03-22 DIAGNOSIS — Z3049 Encounter for surveillance of other contraceptives: Secondary | ICD-10-CM

## 2015-03-22 DIAGNOSIS — M25562 Pain in left knee: Secondary | ICD-10-CM

## 2015-03-22 DIAGNOSIS — M25561 Pain in right knee: Secondary | ICD-10-CM | POA: Insufficient documentation

## 2015-03-22 DIAGNOSIS — E039 Hypothyroidism, unspecified: Secondary | ICD-10-CM | POA: Diagnosis not present

## 2015-03-22 LAB — POCT URINALYSIS DIPSTICK
BILIRUBIN UA: NEGATIVE
GLUCOSE UA: NEGATIVE
NITRITE UA: NEGATIVE
Protein, UA: NEGATIVE
Spec Grav, UA: 1.025
UROBILINOGEN UA: 1
pH, UA: 6.5

## 2015-03-22 LAB — POCT UA - MICROSCOPIC ONLY

## 2015-03-22 LAB — POCT URINE PREGNANCY: Preg Test, Ur: NEGATIVE

## 2015-03-22 LAB — TSH: TSH: 0.549 u[IU]/mL (ref 0.350–4.500)

## 2015-03-22 MED ORDER — METRONIDAZOLE 500 MG PO TABS: 500.0000 mg | ORAL_TABLET | Freq: Two times a day (BID) | ORAL | Status: DC

## 2015-03-22 MED ORDER — ETONOGESTREL-ETHINYL ESTRADIOL 0.12-0.015 MG/24HR VA RING: VAGINAL_RING | VAGINAL | Status: DC

## 2015-03-22 MED ORDER — LEVOTHYROXINE SODIUM 175 MCG PO TABS: 175.0000 ug | ORAL_TABLET | Freq: Every day | ORAL | Status: DC

## 2015-03-22 MED ORDER — CEPHALEXIN 500 MG PO CAPS: 500.0000 mg | ORAL_CAPSULE | Freq: Two times a day (BID) | ORAL | Status: DC

## 2015-03-22 MED ORDER — FLUOXETINE HCL 20 MG PO CAPS: 60.0000 mg | ORAL_CAPSULE | Freq: Every day | ORAL | Status: DC

## 2015-03-22 NOTE — Progress Notes (Signed)
Patient ID: Paula Heath, female   DOB: 08/17/1976, 39 y.o.   MRN: [id number]  HPI:  Follow-up depression: Currently taking Prozac 40 mg daily. Having lots of trouble sleeping. Feels like her mood is generally good. Denies SI or HI.  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 Has noticed that she's gained weight recently and is now having some pain around her knee caps. She thinks the pain is due to gaining weight. She also has an occasional sharp pain in her left calf. He asks about weight loss medication.  Birth control: Currently using NuvaRing. Only has one ring left. Tolerating this well. Denies history of blood clots or migraine with aura. Would like to refill this. Up-to-date on Pap.  Hypothyroidism: Currently taking Synthroid 175 g daily. Tolerating this well. Due for TSH today.  Urinary incontinence: Has had to wear pads for about a month because she is leaking. She does not feel herself leaking urine, but notes that her pad is wet and that it smells like urine. Denies dysuria. She did have what she thought was urinary tract infection a few weeks ago, and drank cranberry juice. Her symptoms then resolved. She still leaking though. She has delivered babies vaginally in the past.  ROS: See HPI.  PMFSH: History of allergies, hypothyroidism, depression, panic attacks  PHYSICAL EXAM: BP 109/88 mmHg  Pulse 82  Temp(Src) 98.6 F (37 C) (Oral)  Ht 5\' 1"  (1.549 m)  Wt 139 lb (63.05 kg)  BMI 26.28 kg/m2 Gen: No acute distress, pleasant, cooperative HEENT: Normocephalic, atraumatic Heart: Regular rate and rhythm, no murmur Lungs: Clear to auscultation bilaterally, normal respiratory effort Neuro: Grossly nonfocal, speech normal Abdomen: No tenderness to palpation, soft, no peritoneal signs, no organomegaly or masses Ext: Bilateral knees without effusion or erythema. Negative patellar apprehension test bilaterally. MCL and LCL intact to varus and valgus stressing  bilaterally. Negative Lachman's bilaterally. No crepitus with extension and flexion. Full range of motion. Psych: Well groomed, normal eye contact, normal speech rate and volume, full range of affect  ASSESSMENT/PLAN:  Hypothyroidism Check TSH today and titrate as needed.   Contraception management Just completed her menses. Tolerating NuvaRing well. Would like refill. Up-to-date on Pap. Refilled NuvaRing for 1 year. No contraindications.   Depression Mood seems relatively well-controlled, but reporting lots of trouble sleeping. Suspect may be due to underlying uncontrolled depression. Increase Prozac to 60 mg daily. Follow-up with me in one month to see how she is doing.   Urinary incontinence Urinalysis checked today and has some white cells and red cells in addition to bacteria. This may be actually due to urinary tract infection, even though she has no dysuria. I will do a trial of Keflex and send urine for culture. I called her after that appointment and let her know this. She also has clue cells in her urine, so bacterial vaginosis could certainly be causing what she perceives to be a continuous leakage of urine. This may all be vaginal discharge. Given prescription for Flagyl to treat for BV. Follow-up if not improving. May benefit from referral to urology eventually, but will hold off on this for now given that she has other possible explanations for her incontinence.   Bilateral knee pain Recurred after recent weight gain. No abnormalities on knee exam today. Encourage exercise, Tylenol or ibuprofen as needed for pain, ice, and weight loss. Informed patient that I do not prescribe weight loss medications.    FOLLOW UP: F/u in one  month for depression and urinary incontinence.  [place] J. [name], MD [facility]

## 2015-03-22 NOTE — Assessment & Plan Note (Signed)
Mood seems relatively well-controlled, but reporting lots of trouble sleeping. Suspect may be due to underlying uncontrolled depression. Increase Prozac to 60 mg daily. Follow-up with me in one month to see how she is doing.

## 2015-03-22 NOTE — Assessment & Plan Note (Signed)
Check TSH today and titrate as needed.

## 2015-03-22 NOTE — Assessment & Plan Note (Signed)
Recurred after recent weight gain. No abnormalities on knee exam today. Encourage exercise, Tylenol or ibuprofen as needed for pain, ice, and weight loss. Informed patient that I do not prescribe weight loss medications.

## 2015-03-22 NOTE — Assessment & Plan Note (Signed)
Urinalysis checked today and has some white cells and red cells in addition to bacteria. This may be actually due to urinary tract infection, even though she has no dysuria. I will do a trial of Keflex and send urine for culture. I called her after that appointment and let her know this. She also has clue cells in her urine, so bacterial vaginosis could certainly be causing what she perceives to be a continuous leakage of urine. This may all be vaginal discharge. Given prescription for Flagyl to treat for BV. Follow-up if not improving. May benefit from referral to urology eventually, but will hold off on this for now given that she has other possible explanations for her incontinence.

## 2015-03-22 NOTE — Patient Instructions (Addendum)
Refilled nuvaring Increase prozac to 60mg  daily (three 20mg  pills a day) Increase exercise to help knees. Also try ice or ibuprofen. Checked thyroid test - will call or send letter. Will send in refills once we have the result.  Follow up with me in 1 month for depression.  Be well, Dr. Pollie MeyerMcIntyre

## 2015-03-22 NOTE — Assessment & Plan Note (Signed)
Just completed her menses. Tolerating NuvaRing well. Would like refill. Up-to-date on Pap. Refilled NuvaRing for 1 year. No contraindications.

## 2015-03-25 LAB — URINE CULTURE

## 2015-03-29 ENCOUNTER — Encounter: Payer: Self-pay | Admitting: Family Medicine

## 2015-03-29 ENCOUNTER — Other Ambulatory Visit: Payer: Self-pay | Admitting: Family Medicine

## 2015-03-29 MED ORDER — LEVOTHYROXINE SODIUM 175 MCG PO TABS: 175.0000 ug | ORAL_TABLET | Freq: Every day | ORAL | Status: DC

## 2015-06-13 ENCOUNTER — Ambulatory Visit (INDEPENDENT_AMBULATORY_CARE_PROVIDER_SITE_OTHER): Payer: 59 | Admitting: Family Medicine

## 2015-06-13 ENCOUNTER — Encounter: Payer: Self-pay | Admitting: Family Medicine

## 2015-06-13 VITALS — BP 109/71 | HR 80 | Temp 98.7°F | Wt 140.7 lb

## 2015-06-13 DIAGNOSIS — G4700 Insomnia, unspecified: Secondary | ICD-10-CM

## 2015-06-13 DIAGNOSIS — R8290 Unspecified abnormal findings in urine: Secondary | ICD-10-CM | POA: Insufficient documentation

## 2015-06-13 DIAGNOSIS — R300 Dysuria: Secondary | ICD-10-CM | POA: Diagnosis not present

## 2015-06-13 DIAGNOSIS — R5383 Other fatigue: Secondary | ICD-10-CM

## 2015-06-13 LAB — POCT URINALYSIS DIPSTICK
Bilirubin, UA: NEGATIVE
Glucose, UA: NEGATIVE
Ketones, UA: NEGATIVE
NITRITE UA: NEGATIVE
Protein, UA: NEGATIVE
Spec Grav, UA: 1.025
Urobilinogen, UA: 0.2
pH, UA: 7

## 2015-06-13 LAB — CBC
HCT: 36.4 % (ref 36.0–46.0)
Hemoglobin: 12.1 g/dL (ref 12.0–15.0)
MCH: 26.9 pg (ref 26.0–34.0)
MCHC: 33.2 g/dL (ref 30.0–36.0)
MCV: 80.9 fL (ref 78.0–100.0)
MPV: 9.4 fL (ref 8.6–12.4)
PLATELETS: 381 10*3/uL (ref 150–400)
RBC: 4.5 MIL/uL (ref 3.87–5.11)
RDW: 14.5 % (ref 11.5–15.5)
WBC: 6.5 10*3/uL (ref 4.0–10.5)

## 2015-06-13 LAB — BASIC METABOLIC PANEL
BUN: 16 mg/dL (ref 6–23)
CO2: 26 mEq/L (ref 19–32)
CREATININE: 0.87 mg/dL (ref 0.50–1.10)
Calcium: 9.2 mg/dL (ref 8.4–10.5)
Chloride: 102 mEq/L (ref 96–112)
Glucose, Bld: 54 mg/dL — ABNORMAL LOW (ref 70–99)
Potassium: 4.2 mEq/L (ref 3.5–5.3)
Sodium: 141 mEq/L (ref 135–145)

## 2015-06-13 LAB — POCT UA - MICROSCOPIC ONLY

## 2015-06-13 LAB — TSH: TSH: 4.507 u[IU]/mL — ABNORMAL HIGH (ref 0.350–4.500)

## 2015-06-13 MED ORDER — HYDROXYZINE HCL 50 MG PO TABS: 50.0000 mg | ORAL_TABLET | Freq: Every day | ORAL | Status: DC

## 2015-06-13 NOTE — Patient Instructions (Signed)
Thank you for coming to the clinic today. It was nice seeing you.  It seems like a lot of your symptoms of fatigue, feeling down, having difficulty sleeping, and difficulty focusing are probably from your depression. We will increase your dose of prozac today.   We will be checking your thyroid and other blood tests to make sure there are not any other causes.   We will start a new medication called hydroxyzine to help you sleep. Please take 1 tablet at bedtime.  We will check your urine today for a bladder infection. We will call you with these results.

## 2015-06-13 NOTE — Addendum Note (Signed)
Addended by: Jennette BillBUSICK, Maylen Waltermire L on: 06/13/2015 10:26 AM   Modules accepted: Orders

## 2015-06-13 NOTE — Progress Notes (Signed)
Paula Heath is a 39 y.o. female who [facility] with a chief complaint of fatigue, insomnia, and strong urine smell. . Her concerns today include:  HPI:  Fatigue / Depression  Patient has longstanding history of hypothyroidism and depression. States that she has been feeling especially fatigued over the past 2 weeks and "off and on" for the past few months. Says that she currently feels really tired and that on the weekends it is hard for her to get motivated to get out of bed. Sleeps all day during these days. Reports that she has not missed any doses of her synthroid or prozac. Rreports that her appetite has been somewhat decreased and that she has had more down days than up days. Still gets enjoyment from playing with her children. Endorses some feelings of guilt. Also endorses difficulty focusing and trouble staying asleep.   PHQ-9 score of 19 today.   Insomnia Is able to fall asleep but then has difficulty staying asleep. Will wake up in the middle of the night and not be able to go back to sleep. Has tried taking OTC melatonin which did not work. States that if she takes her Zyrtec at night it helped some.   Strong Urine Smell Patient reports that her urine has had a strong smell for the past 2 weeks. Also reports some "leakage" for the past 3 weeks. Is concerned about a bladder infection since these are the symptoms that she had last time. No dysuria. No hematuria. Reports that she has been staying well hydrated.   ROS: No fevers or chills, otherwise all systems reviewed and are negative.  Past Medical History - Reviewed and updated Patient Active Problem List   Diagnosis Date Noted  . Fatigue 06/13/2015  . Insomnia 06/13/2015  . Abnormal urine odor 06/13/2015  . Urinary incontinence 03/22/2015  . Bilateral knee pain 03/22/2015  . Drug exanthem 09/03/2014  . Shortness of breath 10/02/2013  . Seasonal allergies 04/27/2013  . Contraception management  04/27/2013  . Chest discomfort 12/27/2012  . Menorrhagia 12/27/2012  . Panic attacks 10/10/2012  . Headache(784.0) 10/10/2012  . Hypothyroidism 08/26/2012  . Depression 07/01/2012    Medications- reviewed and updated Current Outpatient Prescriptions  Medication Sig Dispense Refill  . cetirizine (ZYRTEC) 10 MG tablet Take 10 mg by mouth daily.    Marland Kitchen. etonogestrel-ethinyl estradiol (NUVARING) 0.12-0.015 MG/24HR vaginal ring Insert vaginally and leave in place for 3 consecutive weeks, then remove for 1 week. 1 each 11  . FLUoxetine (PROZAC) 20 MG capsule Take 3 capsules (60 mg total) by mouth daily. 90 capsule 1  . levothyroxine (SYNTHROID, LEVOTHROID) 175 MCG tablet Take 1 tablet (175 mcg total) by mouth daily. 30 tablet 3  . hydrOXYzine (ATARAX/VISTARIL) 50 MG tablet Take 1 tablet (50 mg total) by mouth at bedtime. 30 tablet 3   No current facility-administered medications for this visit.    Objective: Physical Exam: BP 109/71 mmHg  Pulse 80  Temp(Src) 98.7 F (37.1 C)  Wt 140 lb 11.2 oz (63.821 kg)  Gen: NAD, resting comfortably CV: RRR with no murmurs appreciated Lungs: NWOB, CTAB with no crackles, wheezes, or rhonchi Abdomen: Normal bowel sounds present. Soft, Nontender, Nondistended. MSK: no edema, clubbing, or cyanosis.  Skin: warm, dry Neuro: grossly normal, moves all extremities  A/P: See problem list  Fatigue Likely related to patient's depression given constellation of symptoms. Will increase patient's dose of prozac today to 80mg  daily. Follow up in 4 weeks. Can  consider augmentation or trial of different SSRI at next visit if symptoms not improved.   PHQ-9 score of 19 today.   Will check TSH, CBC, and BMP to rule out other etiologies.   Insomnia Likely a component of patient's depression. Anticipate improvement with treatment of patient's depression. Will give short trial of hydroxyzine to use as needed.   Abnormal urine odor Possible UTI. Will check UA.      Orders Placed This Encounter  Procedures  . TSH  . CBC  . Basic metabolic panel  . POCT urinalysis dipstick    Meds ordered this encounter  Medications  . hydrOXYzine (ATARAX/VISTARIL) 50 MG tablet    Sig: Take 1 tablet (50 mg total) by mouth at bedtime.    Dispense:  30 tablet    Refill:  3     Marayah Higdon M. Jimmey Ralph, MD [facility] Resident PGY-1 06/13/2015 9:18 AM

## 2015-06-13 NOTE — Addendum Note (Signed)
Addended by: Jennette BillBUSICK, ROBERT L on: 06/13/2015 11:38 AM   Modules accepted: Orders

## 2015-06-13 NOTE — Assessment & Plan Note (Signed)
Possible UTI. Will check UA.

## 2015-06-13 NOTE — Assessment & Plan Note (Signed)
Likely a component of patient's depression. Anticipate improvement with treatment of patient's depression. Will give short trial of hydroxyzine to use as needed.

## 2015-06-13 NOTE — Assessment & Plan Note (Addendum)
Likely related to patient's depression given constellation of symptoms. Will increase patient's dose of prozac today to  daily. Follow up in 4 weeks. Can consider augmentation or trial of different SSRI at next visit if symptoms not improved.   PHQ-9 score of 19 today.   Will check TSH, CBC, and BMP to rule out other etiologies.

## 2015-06-16 LAB — URINE CULTURE

## 2015-06-18 ENCOUNTER — Telehealth: Payer: Self-pay | Admitting: Family Medicine

## 2015-06-18 DIAGNOSIS — E039 Hypothyroidism, unspecified: Secondary | ICD-10-CM

## 2015-06-18 MED ORDER — CEPHALEXIN 500 MG PO CAPS: 500.0000 mg | ORAL_CAPSULE | Freq: Two times a day (BID) | ORAL | Status: DC

## 2015-06-18 NOTE — Telephone Encounter (Signed)
Called patient to discuss results of urine culture and TSH. Will send in a prescription for Keflex. Will recheck TSH in 3-4 weeks. Patient had no further questions.   Katina Degreealeb M. Jimmey RalphParker, MD [facility] Resident PGY-2 06/18/2015 2:02 PM

## 2015-07-14 ENCOUNTER — Telehealth: Payer: Self-pay | Admitting: Family Medicine

## 2015-07-15 MED ORDER — FLUOXETINE HCL 20 MG PO CAPS: 60.0000 mg | ORAL_CAPSULE | Freq: Every day | ORAL | Status: DC

## 2015-07-15 NOTE — Telephone Encounter (Signed)
2nd request.  Levan Aloia L, RN  

## 2015-07-15 NOTE — Telephone Encounter (Signed)
Pt is checking on the status of her refill request on Prozac. jw

## 2015-07-15 NOTE — Telephone Encounter (Signed)
Please inform pt I sent in her prozac but she needs to schedule a follow up appointment with me.  [name], MD

## 2015-07-16 NOTE — Telephone Encounter (Signed)
Left message on patient voicemail that 1 month supply of medication was sent to pharmacy and to scheduled an appointment before she runs out.

## 2015-10-06 ENCOUNTER — Emergency Department (HOSPITAL_COMMUNITY)
Admission: EM | Admit: 2015-10-06 | Discharge: 2015-10-06 | Disposition: A | Discharge status: Home / Self Care | Payer: 59 | Attending: Emergency Medicine | Admitting: Emergency Medicine

## 2015-10-06 ENCOUNTER — Encounter (HOSPITAL_COMMUNITY): Payer: Self-pay | Admitting: *Deleted

## 2015-10-06 DIAGNOSIS — J45909 Unspecified asthma, uncomplicated: Secondary | ICD-10-CM | POA: Diagnosis not present

## 2015-10-06 DIAGNOSIS — R600 Localized edema: Secondary | ICD-10-CM | POA: Diagnosis present

## 2015-10-06 DIAGNOSIS — E079 Disorder of thyroid, unspecified: Secondary | ICD-10-CM | POA: Diagnosis not present

## 2015-10-06 DIAGNOSIS — Z79899 Other long term (current) drug therapy: Secondary | ICD-10-CM | POA: Diagnosis not present

## 2015-10-06 DIAGNOSIS — E039 Hypothyroidism, unspecified: Secondary | ICD-10-CM | POA: Insufficient documentation

## 2015-10-06 DIAGNOSIS — H00016 Hordeolum externum left eye, unspecified eyelid: Secondary | ICD-10-CM | POA: Diagnosis not present

## 2015-10-06 DIAGNOSIS — F329 Major depressive disorder, single episode, unspecified: Secondary | ICD-10-CM | POA: Diagnosis not present

## 2015-10-06 NOTE — Discharge Instructions (Signed)
Stye A stye is a bump on your eyelid caused by a bacterial infection. A stye can form inside the eyelid (internal stye) or outside the eyelid (external stye). An internal stye may be caused by an infected oil-producing gland inside your eyelid. An external stye may be caused by an infection at the base of your eyelash (hair follicle). Styes are very common. Anyone can get them at any age. They usually occur in just one eye, but you may have more than one in either eye.  CAUSES  The infection is almost always caused by bacteria called Staphylococcus aureus. This is a common type of bacteria that lives on your skin. RISK FACTORS You may be at higher risk for a stye if you have had one before. You may also be at higher risk if you have:  Diabetes.  Long-term illness.  Long-term eye redness.  A skin condition called seborrhea.  High fat levels in your blood (lipids). SIGNS AND SYMPTOMS  Eyelid pain is the most common symptom of a stye. Internal styes are more painful than external styes. Other signs and symptoms may include:  Painful swelling of your eyelid.  A scratchy feeling in your eye.  Tearing and redness of your eye.  Pus draining from the stye. DIAGNOSIS  Your health care provider may be able to diagnose a stye just by examining your eye. The health care provider may also check to make sure:  You do not have a fever or other signs of a more serious infection.  The infection has not spread to other parts of your eye or areas around your eye. TREATMENT  Most styes will clear up in a few days without treatment. In some cases, you may need to use antibiotic drops or ointment to prevent infection. Your health care provider may have to drain the stye surgically if your stye is:  Large.  Causing a lot of pain.  Interfering with your vision. This can be done using a thin blade or a needle.  HOME CARE INSTRUCTIONS   Take medicines only as directed by your health care  provider.  Apply a clean, warm compress to your eye for 10 minutes, 4 times a day.  Do not wear contact lenses or eye makeup until your stye has healed.  Do not try to pop or drain the stye. SEEK MEDICAL CARE IF:  You have chills or a fever.  Your stye does not go away after several days.  Your stye affects your vision.  Your eyeball becomes swollen, red, or painful. MAKE SURE YOU:  Understand these instructions.  Will watch your condition.  Will get help right away if you are not doing well or get worse.   This information is not intended to replace advice given to you by your health care provider. Make sure you discuss any questions you have with your health care provider.   Document Released: 09/09/2005 Document Revised: 12/21/2014 Document Reviewed: 03/16/2014 Elsevier Interactive Patient Education 2016 [employer]vier Inc[employer]  

## 2015-10-06 NOTE — ED Notes (Signed)
Declined W/C at D/C and was escorted to lobby by RN. 

## 2015-10-06 NOTE — ED Provider Notes (Signed)
CSN: [id number]     Arrival date & time 10/06/15  7846 History  By signing my name below, I, Placido Sou, attest that this documentation has been prepared under the direction and in the presence of Teressa Lower, NP. Electronically Signed: Placido Sou, ED Scribe. 10/06/2015. 9:45 AM.   Chief Complaint  Patient presents with  . Facial Swelling   The history is provided by the patient. No language interpreter was used.    HPI Comments: Paula Heath is a 39 y.o. female who presents to the Emergency Department complaining of worsening, mild, left eye irritation and swelling with onset 1 day ago. Pt notes that upon waking she noticed swelling of the left eye with some mild drainage present. Pt notes applying warm compresses to the affected eye which has provided little relief. Pt notes currently taking amoxacillin for cold symptoms that were dx at a previous visit. She notes wearing prescription eyewear. She denies any visual disturbance.   Past Medical History  Diagnosis Date  . Allergy     seasonal  . Anemia     1998  . Asthma     as a child  . Ulcer     with bleeding around age 31  . Thyroid disease   . Depression 07/01/2012  . Hypothyroidism 08/26/2012   No past surgical history on file. Family History  Problem Relation Age of Onset  . Diabetes Maternal Grandmother   . Thyroid disease      Aunt deceased of some form of thyroid disease in 2014   Social History  Substance Use Topics  . Smoking status: Never Smoker   . Smokeless tobacco: Not on file  . Alcohol Use: Yes   OB History    No data available     Review of Systems  HENT: Positive for facial swelling.   Eyes: Positive for pain, discharge and redness.  All other systems reviewed and are negative.  Allergies  Benzyl alcohol and Nickel  Home Medications   Prior to Admission medications   Medication Sig Start Date End Date Taking? Authorizing Provider  cephALEXin (KEFLEX) 500 MG capsule Take 1  capsule (500 mg total) by mouth 2 (two) times daily. 06/18/15   Ardith Dark, MD  cetirizine (ZYRTEC) 10 MG tablet Take 10 mg by mouth daily.    Historical Provider, MD  etonogestrel-ethinyl estradiol (NUVARING) 0.12-0.015 MG/24HR vaginal ring Insert vaginally and leave in place for 3 consecutive weeks, then remove for 1 week. 03/22/15   [name], MD  FLUoxetine (PROZAC) 20 MG capsule Take 3 capsules (60 mg total) by mouth daily. 07/15/15   [name], MD  hydrOXYzine (ATARAX/VISTARIL) 50 MG tablet Take 1 tablet (50 mg total) by mouth at bedtime. 06/13/15   Ardith Dark, MD  levothyroxine (SYNTHROID, LEVOTHROID) 175 MCG tablet Take 1 tablet (175 mcg total) by mouth daily. 03/29/15   [name], MD   There were no vitals taken for this visit. Physical Exam  Constitutional: She is oriented to person, place, and time. She appears well-developed and well-nourished.  HENT:  Head: Normocephalic and atraumatic.  Mouth/Throat: No oropharyngeal exudate.  Eyes: Conjunctivae and EOM are normal. Pupils are equal, round, and reactive to light.  Mild swelling noted to the left upper lid with a small white area noted to under the lid  Neck: Normal range of motion. No tracheal deviation present.  Cardiovascular: Normal rate.   Pulmonary/Chest: Effort normal. No respiratory distress.  Abdominal: Soft. There  is no tenderness.  Musculoskeletal: Normal range of motion.  Neurological: She is alert and oriented to person, place, and time.  Skin: Skin is warm and dry. She is not diaphoretic.  Psychiatric: She has a normal mood and affect. Her behavior is normal.  Nursing note and vitals reviewed.  ED Course  Procedures  COORDINATION OF CARE: 9:40 AM Pt presents today due to left eye irritation and swelling. Discussed treatment plan with pt at bedside including recommendations to continue use of warm compresses and limit contact use until eye is fully healed. Return precautions noted. Pt  agreed to plan.  Labs Review Labs Reviewed - No data to display  Imaging Review No results found.   EKG Interpretation None      MDM   Final diagnoses:  Stye, left    Exam consistent with stye. Discussed symptomatic treatment and return precautions with pt  I personally performed the services described in this documentation, which was scribed in my presence. The recorded information has been reviewed and is accurate.    Teressa LowerVrinda Mairim Bade, NP 10/06/15 1002  Richardean Canalavid H Yao, MD 10/06/15 [phone]

## 2015-10-06 NOTE — ED Notes (Signed)
PT reports LT upper lid started to swell on SAT. Unknown cause per PT.

## 2015-11-12 ENCOUNTER — Encounter: Payer: Self-pay | Admitting: Family Medicine

## 2015-11-12 ENCOUNTER — Ambulatory Visit (INDEPENDENT_AMBULATORY_CARE_PROVIDER_SITE_OTHER): Payer: 59 | Admitting: Family Medicine

## 2015-11-12 VITALS — BP 104/63 | HR 80 | Temp 98.2°F | Ht 62.0 in | Wt 147.8 lb

## 2015-11-12 DIAGNOSIS — F329 Major depressive disorder, single episode, unspecified: Secondary | ICD-10-CM | POA: Diagnosis not present

## 2015-11-12 DIAGNOSIS — F32A Depression, unspecified: Secondary | ICD-10-CM

## 2015-11-12 DIAGNOSIS — H6093 Unspecified otitis externa, bilateral: Secondary | ICD-10-CM | POA: Diagnosis not present

## 2015-11-12 DIAGNOSIS — E039 Hypothyroidism, unspecified: Secondary | ICD-10-CM | POA: Diagnosis not present

## 2015-11-12 MED ORDER — LEVOTHYROXINE SODIUM 175 MCG PO TABS: 175.0000 ug | ORAL_TABLET | Freq: Every day | ORAL | Status: DC

## 2015-11-12 MED ORDER — FLUOXETINE HCL 20 MG PO CAPS: 60.0000 mg | ORAL_CAPSULE | Freq: Every day | ORAL | Status: DC

## 2015-11-12 MED ORDER — CIPROFLOXACIN HCL 0.2 % OT SOLN: 0.2500 mL | Freq: Two times a day (BID) | OTIC | Status: DC

## 2015-11-12 NOTE — Patient Instructions (Signed)
Sent in refills on thyroid medicine and prozac. Sent in antibiotic drops for your ears.  For prozac: -take 20mg  daily for 2 weeks -then take 40mg  daily for 2 weeks -then take the full 60mg  daily  Return in 6 weeks for depression follow up and to have thyroid test rechecked.  Be well, Dr. Pollie MeyerMcIntyre

## 2015-11-13 NOTE — Assessment & Plan Note (Signed)
Poorly controlled due to being out of prozac. Will refill and titrate back up to prior dose. 20mg  for 2 weeks, then 40mg  for 2 weeks, then resume 60. Follow up with me in 6 weeks to eval for improvement.

## 2015-11-13 NOTE — Progress Notes (Signed)
Patient ID: Paula Heath, female   DOB: 10/16/1976, 39 y.o.   MRN: [id number] Date of Visit: 11/12/2015   HPI:  Patient presents for follow up and for medication refills.  Depression - out of prozac for a few months. Has had ups and downs, more downs than ups. Denies SI/HI. Felt better when she was on the medicine. Would like to resume same dose as before (60mg  daily).  Thyroid - has been taking synthroid irregularly for last few weeks due to running low on quantity. Sometimes misses several days. Tolerates it fine when she does take it. Last TSH was elevated but no adjustments were made, was supposed to follow up for repeat in 4-6 weeks but never did. Needs refill. Can tell that thyroid is uncontrolled because her "palms are on fire". This has happened in the past when she wasn't taking medication regularly.  Ear issues - has had pain in ears on and off for several months. Does not use qtips or other foreign objects in ears. Notes drainage from ears, with pain. Not much itching. Took course of oral antibiotics prescribed at a local urgent care center, without lasting relief.  ROS: See HPI.  PMFSH: history of panic attacks/depression, hypothyroidism, seasonal allergies  PHYSICAL EXAM: BP 104/63 mmHg  Pulse 80  Temp(Src) 98.2 F (36.8 C) (Oral)  Ht 5\' 2"  (1.575 m)  Wt 147 lb 12.8 oz (67.042 kg)  BMI 27.03 kg/m2  LMP 10/27/2015 Gen: NAD, pleasant, cooperative HEENT: normocephalic, atraumatic. TMs normal in appearance bilaterally. Ear canals mildly erythematous bilaterally. No drainage noted in canals. Oropharynx clear and moist. Lungs: normal work of breathing  Neuro: alert, grossly nonfocal Psych: normal range of affect, well groomed, speech normal in rate and volume, normal eye contact  Ext: atraumatic  ASSESSMENT/PLAN:  Depression Poorly controlled due to being out of prozac. Will refill and titrate back up to prior dose. 20mg  for 2 weeks, then 40mg  for 2 weeks, then resume 60.  Follow up with me in 6 weeks to eval for improvement.  Hypothyroidism Doubt utility of checking TSH today due to admitted intermittent lapses in medication compliance. Will refill synthroid at prior dose and have patient follow up in 6 weeks for check of TSH.   Otitis externa Symptoms and exam consistent with mild chronic otitis externa. Treat with ciprofloxacin otic. If not improving with this would refer to ENT.  FOLLOW UP: F/u in 6 weeks for depression & hypothyroidism.  GrenadaBrittany J. Pollie MeyerMcIntyre, MD [facility]

## 2015-11-13 NOTE — Assessment & Plan Note (Signed)
Doubt utility of checking TSH today due to admitted intermittent lapses in medication compliance. Will refill synthroid at prior dose and have patient follow up in 6 weeks for check of TSH.

## 2016-01-27 ENCOUNTER — Ambulatory Visit (INDEPENDENT_AMBULATORY_CARE_PROVIDER_SITE_OTHER): Payer: 59 | Admitting: Family Medicine

## 2016-01-27 ENCOUNTER — Encounter: Payer: Self-pay | Admitting: Family Medicine

## 2016-01-27 VITALS — BP 125/72 | HR 76 | Temp 98.6°F | Ht 62.0 in | Wt 147.5 lb

## 2016-01-27 DIAGNOSIS — E039 Hypothyroidism, unspecified: Secondary | ICD-10-CM | POA: Diagnosis not present

## 2016-01-27 DIAGNOSIS — Z114 Encounter for screening for human immunodeficiency virus [HIV]: Secondary | ICD-10-CM | POA: Diagnosis not present

## 2016-01-27 DIAGNOSIS — R238 Other skin changes: Secondary | ICD-10-CM

## 2016-01-27 DIAGNOSIS — F329 Major depressive disorder, single episode, unspecified: Secondary | ICD-10-CM | POA: Diagnosis not present

## 2016-01-27 DIAGNOSIS — F32A Depression, unspecified: Secondary | ICD-10-CM

## 2016-01-27 DIAGNOSIS — R233 Spontaneous ecchymoses: Secondary | ICD-10-CM

## 2016-01-27 MED ORDER — FLUOXETINE HCL 20 MG PO CAPS: 60.0000 mg | ORAL_CAPSULE | Freq: Every day | ORAL | Status: DC

## 2016-01-27 MED ORDER — LEVOTHYROXINE SODIUM 175 MCG PO TABS: 175.0000 ug | ORAL_TABLET | Freq: Every day | ORAL | Status: DC

## 2016-01-27 NOTE — Progress Notes (Signed)
Date of Visit: 01/27/2016   HPI:  Patient presents to follow up on depression and hypothyroidism, also to discuss easy bruising.  Depression - taking prozac  daily. Tolerating this well. Mood is better. Does not think mood should improve more, satisfied with results. Denies SI/HI.  Hypothyroidism - taking synthroid daily. Things feel better. Hands no longer burning. Feels like thyroid is better controlled. Due for TSH.  Easy bruising - about once every month or every few months notices bruising for which she can't identify the etiology. Wants it investigated. No bleeding when she brushes her teeth. Not a daily occurrence. Will randomly find a bruise on her leg, usually just a couple centimeters in diameter. Not particularly painful.   ROS: See HPI.  PMFSH: history of hypothyroidism and depression  PHYSICAL EXAM: BP 125/72 mmHg  Pulse 76  Temp(Src) 98.6 F (37 C) (Oral)  Ht  (1.575 m)  Wt 147 lb 8 oz (66.906 kg)  BMI 26.97 kg/m2 Gen: NAD, pleasant, cooperative HEENT: normocephalic, atraumatic, oropharynx clear and moist. Thyroid mildly enlarged, but nontender and without nodules Heart: regular rate and rhythm, no murmur Lungs: clear to auscultation bilaterally, normal work of breathing  Neuro: alert, grossly nonfocal, speech normal Ext: No appreciable lower extremity edema bilaterally  Skin: no bruising noted on visualized skin (pt did not show me the bruise of concern)  ASSESSMENT/PLAN:  Health maintenance:  -will check HIV screen with labs during lab appointment   Hypothyroidism Stable by symptoms. Will have patient return for lab visit to check TSH, and titrate synthroid as needed.  Depression Well controlled. Continue current regimen.   Easy bruising Likely insignificant based on history. Will check CBC and coags when patient returns for lab appointment.    FOLLOW UP: Schedule lab appointment See me in 3 months for chronic medical  problems.  [place] J. [name], MD [facility]

## 2016-01-27 NOTE — Patient Instructions (Signed)
Schedule lab visit on the way out Refills sent in  Will check your blood counts/platelets and how well your blood clots Likely the bruising is not anything to worry about  Be well, Dr. [name]

## 2016-01-28 DIAGNOSIS — R238 Other skin changes: Secondary | ICD-10-CM | POA: Insufficient documentation

## 2016-01-28 DIAGNOSIS — R233 Spontaneous ecchymoses: Secondary | ICD-10-CM | POA: Insufficient documentation

## 2016-01-28 NOTE — Assessment & Plan Note (Signed)
Likely insignificant based on history. Will check CBC and coags when patient returns for lab appointment.

## 2016-01-28 NOTE — Assessment & Plan Note (Signed)
Well-controlled.  Continue current regimen. 

## 2016-01-28 NOTE — Assessment & Plan Note (Signed)
Stable by symptoms. Will have patient return for lab visit to check TSH, and titrate synthroid as needed.

## 2016-03-26 ENCOUNTER — Other Ambulatory Visit: Payer: Self-pay | Admitting: Family Medicine

## 2016-04-10 ENCOUNTER — Other Ambulatory Visit (INDEPENDENT_AMBULATORY_CARE_PROVIDER_SITE_OTHER): Payer: 59

## 2016-04-10 ENCOUNTER — Encounter: Payer: Self-pay | Admitting: Family Medicine

## 2016-04-10 ENCOUNTER — Encounter: Payer: 59 | Admitting: Family Medicine

## 2016-04-10 DIAGNOSIS — T148 Other injury of unspecified body region: Secondary | ICD-10-CM | POA: Diagnosis not present

## 2016-04-10 DIAGNOSIS — T148XXA Other injury of unspecified body region, initial encounter: Secondary | ICD-10-CM

## 2016-04-10 LAB — POCT INR: INR: 0.9

## 2016-04-10 LAB — CBC
HEMATOCRIT: 33.5 % — AB (ref 35.0–45.0)
Hemoglobin: 11 g/dL — ABNORMAL LOW (ref 11.7–15.5)
MCH: 26.3 pg — ABNORMAL LOW (ref 27.0–33.0)
MCHC: 32.8 g/dL (ref 32.0–36.0)
MCV: 80.1 fL (ref 80.0–100.0)
MPV: 9.8 fL (ref 7.5–12.5)
Platelets: 388 10*3/uL (ref 140–400)
RBC: 4.18 MIL/uL (ref 3.80–5.10)
RDW: 15.2 % — AB (ref 11.0–15.0)
WBC: 7.1 10*3/uL (ref 3.8–10.8)

## 2016-04-10 LAB — TSH: TSH: 50.61 mIU/L — ABNORMAL HIGH

## 2016-04-11 LAB — HIV ANTIBODY (ROUTINE TESTING W REFLEX): HIV 1&2 Ab, 4th Generation: NONREACTIVE

## 2016-04-12 NOTE — Progress Notes (Signed)
Erroneous encounter-disregard

## 2016-04-14 ENCOUNTER — Telehealth: Payer: Self-pay | Admitting: Family Medicine

## 2016-04-14 DIAGNOSIS — E039 Hypothyroidism, unspecified: Secondary | ICD-10-CM

## 2016-04-14 DIAGNOSIS — D649 Anemia, unspecified: Secondary | ICD-10-CM

## 2016-04-14 NOTE — Telephone Encounter (Signed)
Attempted to reach patient to discuss labs. No answer. Left voicemail asking her to return the call.  Thyroid is not controlled. Likely need to change her medicine. I'd like to speak with her on the phone about this first. Please get in touch with me when she calls back.  Latrelle DodrillBrittany J Valoree Agent, MD

## 2016-04-14 NOTE — Telephone Encounter (Signed)
Patient called back and I spoke with her. She reports that the week prior to getting her labs drawn, she missed two doses of her synthroid when she was out of town.  This may skew her TSH. Will plan to repeat in 1 month. Will also check iron studies at that time due to mild anemia.  Paula DodrillBrittany J Frazer [place], MD

## 2016-05-15 ENCOUNTER — Other Ambulatory Visit: Payer: Self-pay | Admitting: Family Medicine

## 2016-08-11 ENCOUNTER — Other Ambulatory Visit (HOSPITAL_COMMUNITY)
Admission: RE | Admit: 2016-08-11 | Discharge: 2016-08-11 | Disposition: A | Discharge status: Home / Self Care | Payer: 59 | Source: Ambulatory Visit | Attending: Family Medicine | Admitting: Family Medicine

## 2016-08-11 ENCOUNTER — Ambulatory Visit (INDEPENDENT_AMBULATORY_CARE_PROVIDER_SITE_OTHER): Payer: 59 | Admitting: Family Medicine

## 2016-08-11 ENCOUNTER — Encounter: Payer: Self-pay | Admitting: Family Medicine

## 2016-08-11 VITALS — BP 114/73 | HR 74 | Temp 98.4°F | Ht 62.0 in | Wt 141.0 lb

## 2016-08-11 DIAGNOSIS — N760 Acute vaginitis: Secondary | ICD-10-CM | POA: Diagnosis present

## 2016-08-11 DIAGNOSIS — Z113 Encounter for screening for infections with a predominantly sexual mode of transmission: Secondary | ICD-10-CM | POA: Diagnosis not present

## 2016-08-11 DIAGNOSIS — F329 Major depressive disorder, single episode, unspecified: Secondary | ICD-10-CM

## 2016-08-11 DIAGNOSIS — J302 Other seasonal allergic rhinitis: Secondary | ICD-10-CM

## 2016-08-11 DIAGNOSIS — F32A Depression, unspecified: Secondary | ICD-10-CM

## 2016-08-11 DIAGNOSIS — D649 Anemia, unspecified: Secondary | ICD-10-CM | POA: Diagnosis not present

## 2016-08-11 DIAGNOSIS — E039 Hypothyroidism, unspecified: Secondary | ICD-10-CM

## 2016-08-11 DIAGNOSIS — R32 Unspecified urinary incontinence: Secondary | ICD-10-CM | POA: Diagnosis not present

## 2016-08-11 DIAGNOSIS — N766 Ulceration of vulva: Secondary | ICD-10-CM | POA: Diagnosis not present

## 2016-08-11 LAB — POCT WET PREP (WET MOUNT)
CLUE CELLS WET PREP WHIFF POC: NEGATIVE
TRICHOMONAS WET PREP HPF POC: ABSENT

## 2016-08-11 LAB — POCT UA - MICROSCOPIC ONLY

## 2016-08-11 LAB — POCT URINALYSIS DIPSTICK
Glucose, UA: NEGATIVE
NITRITE UA: POSITIVE
SPEC GRAV UA: 1.025
Urobilinogen, UA: 2
pH, UA: 6.5

## 2016-08-11 LAB — TSH: TSH: 0.16 mIU/L — ABNORMAL LOW

## 2016-08-11 LAB — POCT URINE PREGNANCY: Preg Test, Ur: NEGATIVE

## 2016-08-11 MED ORDER — CETIRIZINE HCL 10 MG PO TABS: 10.0000 mg | ORAL_TABLET | Freq: Every day | ORAL | 3 refills | Status: DC

## 2016-08-11 MED ORDER — MELOXICAM 15 MG PO TABS
15.0000 mg | ORAL_TABLET | Freq: Every day | ORAL | 0 refills | Status: DC
Start: 2016-08-11 — End: 2017-12-27

## 2016-08-11 MED ORDER — CEPHALEXIN 500 MG PO CAPS
500.0000 mg | ORAL_CAPSULE | Freq: Two times a day (BID) | ORAL | 0 refills | Status: DC
Start: 2016-08-11 — End: 2016-08-28

## 2016-08-11 NOTE — Patient Instructions (Addendum)
Checking thyroid and iron levels  For wrist and shoulder: -start mobic 15 mg daily for 1 week, then use daily as needed -don't mix with ibuprofen, naproxen, etc  For urine: -I think you have a UTI -take keflex 500mg  twice daily for 7 days -follow up if not improving with this  For allergies: -sent in zyrtec, printed letter saying it's medically necessary  Follow up with me in 1 month to see how these things are doing  Be well, Dr. Pollie MeyerMcIntyre

## 2016-08-11 NOTE — Progress Notes (Signed)
Date of Visit: 08/11/2016   HPI:  Patient presents for follow up of thyroid, also to discuss:  - urine leakage - happening for months. Urine smells strong. Thinks she may have a UTI. No burning or frequency. No fevers or back pain. Has had two prior vaginal deliveries.   - L shoulder pain - present for a few days. Also has some pain in L wrist. Saw urgent care for the wrist and was given some medicine for it, unknown name. Also got a shot. It helped. Shoulder pain is worse with movement. Left handed.  Types a lot at work.   - hypothyroidism - feels symptoms are well controlled. Taking synthroid 175mcg daily.  - depression - taking prozac daily. Well controlled. No SI/HI.  ROS: See HPI.  PMFSH: history of depression, hypothyroidism  PHYSICAL EXAM: BP 114/73   Pulse 74   Temp 98.4 F (36.9 C) (Oral)   Ht 5\' 2"  (1.575 m)   Wt 141 lb (64 kg)   BMI 25.79 kg/m  Gen: NAD, pleasant, cooperative HEENT: normocephalic, atraumatic, moist mucous membranes. Nares patent. Tympanic membranes normal bilaterally. No anterior cervical or supraclavicular lymphadenopathy.  Heart: regular rate and rhythm, no murmur Lungs: clear to auscultation bilaterally normal work of breathing  Neuro: alert grossly nonfocal speech normal Ext: No appreciable lower extremity edema bilaterally  Full range of motion of L shoulder. Grip 5/5 bilaterally. Full strength all movements L shoulder. Neg neers & empty can. L shoulder nontender to palpation.  GU: normal appearing external genitalia except for ulcerated lesion on R aspect of clitoral hood, nontender to palpation. Vagina is moist with normal white discharge. Cervix normal in appearance. No cervical motion tenderness or tenderness on bimanual exam. No adnexal masses. nuvaring in place.  ASSESSMENT/PLAN:  Urinary incontinence Brought up by patient as a new complaint during this visit, though now I see that we have addressed this before almost 1.5 years ago. UA  suggestive of UTI despite less obvious symptoms (just having urinary incontinence and strong smell) Treat with keflex for UTI, culture urine, follow up in 1 month to eval for improvement, sooner if not improved Also performed gc/chlamydia and wet prep today to rule out vaginal/gynecological causes of perceived urinary incontinence Will plan to repeat UA at future visit to ensure abnormalities have resolved. Low threshold for urology referral.  Hypothyroidism Check TSH today, titrate as needed.  Anemia Mild anemia noted on prior labs. Check iron studies today.  Depression Well controlled. Continue current regimen.   Seasonal allergies Improved when she takes zyrtec. Letter written saying this medication is medically necessary, so she can use [facility] to buy it.  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 Swabbed today for HSV culture, though may not return positive as there was no vesicle to unroof. Patient was unaware of this lesion before today. Denies history of herpes or partner with herpes.  L shoulder/wrist pain Exam unremarkable. Trial of mobic  FOLLOW UP: Follow up in 1 mo for above issues **Repeat UA that visit**  GrenadaBrittany J. Pollie MeyerMcIntyre, MD [facility]

## 2016-08-12 LAB — IRON AND TIBC
%SAT: 9 % — AB (ref 11–50)
IRON: 41 ug/dL (ref 40–190)
TIBC: 448 ug/dL (ref 250–450)
UIBC: 407 ug/dL — AB (ref 125–400)

## 2016-08-12 LAB — CERVICOVAGINAL ANCILLARY ONLY
Chlamydia: NEGATIVE
Neisseria Gonorrhea: NEGATIVE
Trichomonas: NEGATIVE

## 2016-08-12 LAB — FERRITIN: FERRITIN: 12 ng/mL (ref 10–232)

## 2016-08-13 LAB — HERPES SIMPLEX VIRUS CULTURE: ORGANISM ID, BACTERIA: NOT DETECTED

## 2016-08-14 ENCOUNTER — Telehealth: Payer: Self-pay | Admitting: Family Medicine

## 2016-08-14 DIAGNOSIS — D649 Anemia, unspecified: Secondary | ICD-10-CM | POA: Insufficient documentation

## 2016-08-14 LAB — URINE CULTURE: Colony Count: >=100000

## 2016-08-14 MED ORDER — LEVOTHYROXINE SODIUM 150 MCG PO TABS: 175.0000 ug | ORAL_TABLET | Freq: Every day | ORAL | 1 refills | Status: DC

## 2016-08-14 NOTE — Assessment & Plan Note (Signed)
Improved when she takes zyrtec. Letter written saying this medication is medically necessary, so she can use [facility] to buy it.

## 2016-08-14 NOTE — Assessment & Plan Note (Signed)
Mild anemia noted on prior labs. Check iron studies today.

## 2016-08-14 NOTE — Assessment & Plan Note (Addendum)
Brought up by patient as a new complaint during this visit, though now I see that we have addressed this before almost 1.5 years ago. UA suggestive of UTI despite less obvious symptoms (just having urinary incontinence and strong smell) Treat with keflex for UTI, culture urine, follow up in 1 month to eval for improvement, sooner if not improved Also performed gc/chlamydia and wet prep today to rule out vaginal/gynecological causes of perceived urinary incontinence Will plan to repeat UA at future visit to ensure abnormalities have resolved. Low threshold for urology referral.

## 2016-08-14 NOTE — Telephone Encounter (Signed)
Called patient to discuss results Urine grew e coli, sensitive to keflex Follow up in 1 month & repeat UA  TSH low - need to lower synthroid. Will rx 150mcg daily. Recheck at follow up visit in 1 month  Other labs ok (STD check, iron studies, herpes culture)  Patient appreciative of call Paula DodrillBrittany J Chrissa Meetze, MD

## 2016-08-14 NOTE — Assessment & Plan Note (Signed)
Check TSH today, titrate as needed.

## 2016-08-14 NOTE — Assessment & Plan Note (Signed)
Well-controlled.  Continue current regimen. 

## 2016-08-28 ENCOUNTER — Telehealth: Payer: Self-pay | Admitting: Family Medicine

## 2016-08-28 MED ORDER — CEPHALEXIN 500 MG PO CAPS: 500.0000 mg | ORAL_CAPSULE | Freq: Two times a day (BID) | ORAL | 0 refills | Status: DC

## 2016-08-28 NOTE — Telephone Encounter (Signed)
Patient called in stating her daughter threw away her keflex, needs new rx Will send in another week's worth  Latrelle DodrillBrittany J Angad Nabers, MD

## 2016-11-17 ENCOUNTER — Other Ambulatory Visit: Payer: Self-pay | Admitting: Family Medicine

## 2017-01-08 ENCOUNTER — Other Ambulatory Visit: Payer: Self-pay | Admitting: Family Medicine

## 2017-01-14 ENCOUNTER — Other Ambulatory Visit: Payer: Self-pay | Admitting: *Deleted

## 2017-01-14 MED ORDER — LEVOTHYROXINE SODIUM 150 MCG PO TABS: 150.0000 ug | ORAL_TABLET | Freq: Every day | ORAL | 0 refills | Status: DC

## 2017-01-14 NOTE — Telephone Encounter (Signed)
Please advise patient to schedule appointment with me, need to recheck TSH Will send in 1 month worth for her  Latrelle DodrillBrittany J Kaiyah Eber, MD

## 2017-01-15 NOTE — Telephone Encounter (Signed)
LMOVM for pt to call us back. When she calls please schedule and appt. Brix Brearley Bruna PotterBlount, CMA

## 2017-01-18 NOTE — Telephone Encounter (Signed)
Left another message for patient to call office. Maryjean Mornempestt S Roberts, CMA

## 2017-06-09 ENCOUNTER — Ambulatory Visit (HOSPITAL_COMMUNITY)
Admission: EM | Admit: 2017-06-09 | Discharge: 2017-06-09 | Disposition: A | Discharge status: Home / Self Care | Payer: 59 | Attending: Family Medicine | Admitting: Family Medicine

## 2017-06-09 ENCOUNTER — Encounter (HOSPITAL_COMMUNITY): Payer: Self-pay

## 2017-06-09 DIAGNOSIS — S46812A Strain of other muscles, fascia and tendons at shoulder and upper arm level, left arm, initial encounter: Secondary | ICD-10-CM

## 2017-06-09 DIAGNOSIS — S46819A Strain of other muscles, fascia and tendons at shoulder and upper arm level, unspecified arm, initial encounter: Secondary | ICD-10-CM

## 2017-06-09 DIAGNOSIS — S46811A Strain of other muscles, fascia and tendons at shoulder and upper arm level, right arm, initial encounter: Secondary | ICD-10-CM | POA: Diagnosis not present

## 2017-06-09 MED ORDER — CYCLOBENZAPRINE HCL 10 MG PO TABS: 10.0000 mg | ORAL_TABLET | Freq: Two times a day (BID) | ORAL | 0 refills | Status: DC | PRN

## 2017-06-09 NOTE — ED Provider Notes (Signed)
  [facility]   [id number] 06/09/17 Arrival Time: 1752  ASSESSMENT & PLAN:  1. Strain of trapezius muscle, unspecified laterality, initial encounter     Meds ordered this encounter  Medications  . cyclobenzaprine (FLEXERIL) 10 MG tablet    Sig: Take 1 tablet (10 mg total) by mouth 2 (two) times daily as needed for muscle spasms.    Dispense:  20 tablet    Refill:  0    Reviewed expectations re: course of current medical issues. Questions answered. Outlined signs and symptoms indicating need for more acute intervention. Follow up here or in the Emergency Department if worsening. Patient verbalized understanding. After Visit Summary given.   SUBJECTIVE:  Paula Heath is a 41 y.o. female who presents with complaint of gradual but fairly quickly onset of upper back/shoulder discomfort. Describes as soreness. Certain positions or movements worsen temporarily. Difficult to find comfortable position. No specific neck pain. No UE weakness or sensation changes. Ibuprofen without relief. No injury. Does sit at computer most of day.  ROS: As per HPI.   OBJECTIVE:  Vitals:   06/09/17 1813  BP: 119/75  Pulse: 78  Resp: 16  Temp: 98.6 F (37 C)  TempSrc: Oral  SpO2: 100%     General appearance: alert, cooperative, appears stated age and no distress Head: normocephalic; atraumatic Eyes: conjunctivae/corneas normal; EOMI. Neck: supple Back:  ROM normal; pretty tender to palpation over bilateral trapezius musculature; no midline tenderness Shoulders with FROM; no bony tenderness Lungs: clear to auscultation bilaterally Heart: regular rate and rhythm Skin: warm and dry; no rashes or lesions Neurologic: normal symmetric reflexes of UE bilat     Allergies  Allergen Reactions  . Benzyl Alcohol Swelling  . Nickel Swelling    PMHx, SurgHx, SocialHx, Medications, and Allergies were reviewed in the Visit Navigator and updated as appropriate.       Mardella LaymanHagler,  Paula Edge, MD 06/10/17 [phone]

## 2017-06-09 NOTE — ED Triage Notes (Addendum)
[facility] of bilateral shoulder pain since yesterday 06/08/2017 pt states she cant describe the pain but it hurts. Pt has taken ibuprofen 800 mg but has no relief, pt denies any injuries

## 2017-12-24 ENCOUNTER — Ambulatory Visit (HOSPITAL_COMMUNITY)
Admission: EM | Admit: 2017-12-24 | Discharge: 2017-12-24 | Disposition: A | Discharge status: Home / Self Care | Payer: Managed Care, Other (non HMO) | Attending: Emergency Medicine | Admitting: Emergency Medicine

## 2017-12-24 ENCOUNTER — Encounter (HOSPITAL_COMMUNITY): Payer: Self-pay | Admitting: Emergency Medicine

## 2017-12-24 ENCOUNTER — Other Ambulatory Visit: Payer: Self-pay

## 2017-12-24 DIAGNOSIS — Z79899 Other long term (current) drug therapy: Secondary | ICD-10-CM | POA: Insufficient documentation

## 2017-12-24 DIAGNOSIS — R350 Frequency of micturition: Secondary | ICD-10-CM

## 2017-12-24 DIAGNOSIS — N390 Urinary tract infection, site not specified: Secondary | ICD-10-CM | POA: Insufficient documentation

## 2017-12-24 DIAGNOSIS — Z79890 Hormone replacement therapy (postmenopausal): Secondary | ICD-10-CM | POA: Diagnosis not present

## 2017-12-24 DIAGNOSIS — E039 Hypothyroidism, unspecified: Secondary | ICD-10-CM | POA: Insufficient documentation

## 2017-12-24 DIAGNOSIS — F329 Major depressive disorder, single episode, unspecified: Secondary | ICD-10-CM | POA: Insufficient documentation

## 2017-12-24 LAB — POCT URINALYSIS DIP (DEVICE)
Glucose, UA: 100 mg/dL — AB
Nitrite: POSITIVE — AB
Protein, ur: 30 mg/dL — AB
Specific Gravity, Urine: 1.025 (ref 1.005–1.030)
Urobilinogen, UA: 4 mg/dL — ABNORMAL HIGH (ref 0.0–1.0)
pH: 5.5 (ref 5.0–8.0)

## 2017-12-24 MED ORDER — PHENAZOPYRIDINE HCL 200 MG PO TABS: 200.0000 mg | ORAL_TABLET | Freq: Three times a day (TID) | ORAL | 0 refills | Status: DC

## 2017-12-24 MED ORDER — CIPROFLOXACIN HCL 500 MG PO TABS: 500.0000 mg | ORAL_TABLET | Freq: Two times a day (BID) | ORAL | 0 refills | Status: DC

## 2017-12-24 NOTE — ED Notes (Signed)
Patient is taking otc AZO, this does alter the results on the UA dip.

## 2017-12-24 NOTE — Discharge Instructions (Addendum)
Drink plenty of fluids We will send your urine for a culture if it returns anything positive will call you

## 2017-12-24 NOTE — ED Triage Notes (Signed)
Pt reports vaginal pain, dark urine and blood in her urine that started yesterday.  Pt reports taking two doses of Azo at home.

## 2017-12-24 NOTE — ED Provider Notes (Signed)
[facility]    CSN: [id number] Arrival date & time: 12/24/17  1459     History   Chief Complaint Chief Complaint  Patient presents with  . Urinary Tract Infection    HPI Paula Heath is a 42 y.o. female.   Pt here for frequency and seen blood in urine. Denies any fevers, no n/v/d, denies any abd pain. Took azo for urine frequency but did not help any.       Past Medical History:  Diagnosis Date  . Allergy    seasonal  . Anemia    1998  . Asthma    as a child  . Depression 07/01/2012  . Hypothyroidism 08/26/2012  . Thyroid disease   . Ulcer    with bleeding around age 18    Patient Active Problem List   Diagnosis Date Noted  . Anemia 08/14/2016  . Easy bruising 01/28/2016  . Fatigue 06/13/2015  . Insomnia 06/13/2015  . Abnormal urine odor 06/13/2015  . Urinary incontinence 03/22/2015  . Bilateral knee pain 03/22/2015  . Drug exanthem 09/03/2014  . Shortness of breath 10/02/2013  . Seasonal allergies 04/27/2013  . Contraception management 04/27/2013  . Chest discomfort 12/27/2012  . Menorrhagia 12/27/2012  . Panic attacks 10/10/2012  . Headache(784.0) 10/10/2012  . Hypothyroidism 08/26/2012  . Depression 07/01/2012    History reviewed. No pertinent surgical history.  OB History    No data available       Home Medications    Prior to Admission medications   Medication Sig Start Date End Date Taking? Authorizing Provider  cetirizine (ZYRTEC) 10 MG tablet Take 1 tablet (10 mg total) by mouth daily. 08/11/16  Yes [name], MD  FLUoxetine (PROZAC) 20 MG capsule TAKE THREE CAPSULES BY MOUTH ONCE DAILY 11/20/16  Yes [name], MD  levothyroxine (SYNTHROID, LEVOTHROID) 150 MCG tablet Take 1 tablet (150 mcg total) by mouth daily. 01/14/17  Yes [name], MD  NUVARING 0.12-0.015 MG/24HR vaginal ring INSERT VAGINALLY AND LEAVE IN PLACE FOR 3 CONSECUTIVE WEEKS, THEN REMOVE FOR 1 WEEK 03/26/16  Yes [name], MD  cephALEXin (KEFLEX) 500 MG capsule Take 1 capsule (500 mg total) by mouth 2 (two) times daily. For 7 days 08/28/16   [name], MD  ciprofloxacin (CIPRO) 500 MG tablet Take 1 tablet (500 mg total) by mouth 2 (two) times daily. 12/24/17   Coralyn Mark, NP  Ciprofloxacin HCl 0.2 % otic solution Place 0.25 mLs into both ears 2 (two) times daily. 11/12/15   [name], MD  cyclobenzaprine (FLEXERIL) 10 MG tablet Take 1 tablet (10 mg total) by mouth 2 (two) times daily as needed for muscle spasms. 06/09/17   Mardella Layman, MD  meloxicam (MOBIC) 15 MG tablet Take 1 tablet (15 mg total) by mouth daily. x 7 days then daily as needed 08/11/16   [name], MD  phenazopyridine (PYRIDIUM) 200 MG tablet Take 1 tablet (200 mg total) by mouth 3 (three) times daily. 12/24/17   Coralyn Mark, NP    Family History Family History  Problem Relation Age of Onset  . Diabetes Maternal Grandmother   . Thyroid disease Unknown        Aunt deceased of some form of thyroid disease in 2014    Social History Social History   Tobacco Use  . Smoking status: Never Smoker  . Smokeless tobacco: Never Used  Substance Use Topics  . Alcohol use: Yes  .  Drug use: No     Allergies   Benzyl alcohol and Nickel   Review of Systems Review of Systems  Constitutional: Negative.   Respiratory: Negative.   Cardiovascular: Negative.   Gastrointestinal: Negative.   Genitourinary: Positive for frequency and urgency.     Physical Exam Triage Vital Signs ED Triage Vitals  Enc Vitals Group     BP 12/24/17 1518 121/74     Pulse Rate 12/24/17 1518 86     Resp --      Temp 12/24/17 1518 98.6 F (37 C)     Temp Source 12/24/17 1518 Oral     SpO2 12/24/17 1518 98 %     Weight --      Height --      Head Circumference --      Peak Flow --      Pain Score 12/24/17 1517 7     Pain Loc --      Pain Edu? --      Excl. in GC? --    No data found.  Updated Vital  Signs BP 121/74 (BP Location: Left Arm)   Pulse 86   Temp 98.6 F (37 C) (Oral)   LMP 12/18/2017 (Approximate)   SpO2 98%   Visual Acuity Right Eye Distance:   Left Eye Distance:   Bilateral Distance:    Right Eye Near:   Left Eye Near:    Bilateral Near:     Physical Exam  Constitutional: She appears well-developed.  Cardiovascular: Normal rate and regular rhythm.  Pulmonary/Chest: Effort normal and breath sounds normal.  Abdominal: Soft. Bowel sounds are normal.  -cv tenderness, denies any pelvic pain,   Neurological: She is alert.  Skin: Skin is warm.     UC Treatments / Results  Labs (all labs ordered are listed, but only abnormal results are displayed) Labs Reviewed  POCT URINALYSIS DIP (DEVICE) - Abnormal; Notable for the following components:      Result Value   Glucose, UA 100 (*)    Bilirubin Urine SMALL (*)    Ketones, ur TRACE (*)    Hgb urine dipstick TRACE (*)    Protein, ur 30 (*)    Urobilinogen, UA 4.0 (*)    Nitrite POSITIVE (*)    Leukocytes, UA TRACE (*)    All other components within normal limits  URINE CULTURE    EKG  EKG Interpretation None       Radiology No results found.  Procedures Procedures (including critical care time)  Medications Ordered in UC Medications - No data to display   Initial Impression / Assessment and Plan / UC Course  I have reviewed the triage vital signs and the nursing notes.  Pertinent labs & imaging results that were available during my care of the patient were reviewed by me and considered in my medical decision making (see chart for details).     Stay hydrated  Take meds with food  Will need to f/u in 1 week for urine re check    Final Clinical Impressions(s) / UC Diagnoses   Final diagnoses:  Lower urinary tract infectious disease  Urinary frequency    ED Discharge Orders        Ordered    ciprofloxacin (CIPRO) 500 MG tablet  2 times daily     12/24/17 1606    phenazopyridine  (PYRIDIUM) 200 MG tablet  3 times daily     12/24/17 1606       Controlled Substance  Prescriptions [place] Controlled Substance Registry consulted? Not Applicable   Coralyn Mark, NP 12/24/17 1616

## 2017-12-27 LAB — URINE CULTURE: Culture: 80000 — AB

## 2018-03-08 ENCOUNTER — Encounter: Payer: Self-pay | Admitting: Family Medicine

## 2018-03-08 ENCOUNTER — Other Ambulatory Visit: Payer: Self-pay

## 2018-03-08 ENCOUNTER — Ambulatory Visit: Payer: Managed Care, Other (non HMO) | Admitting: Family Medicine

## 2018-03-08 VITALS — BP 110/70 | HR 78 | Temp 98.3°F | Ht 62.0 in | Wt 150.4 lb

## 2018-03-08 DIAGNOSIS — F32A Depression, unspecified: Secondary | ICD-10-CM

## 2018-03-08 DIAGNOSIS — Z1231 Encounter for screening mammogram for malignant neoplasm of breast: Secondary | ICD-10-CM | POA: Diagnosis not present

## 2018-03-08 DIAGNOSIS — K5900 Constipation, unspecified: Secondary | ICD-10-CM | POA: Diagnosis not present

## 2018-03-08 DIAGNOSIS — E039 Hypothyroidism, unspecified: Secondary | ICD-10-CM | POA: Diagnosis not present

## 2018-03-08 DIAGNOSIS — L509 Urticaria, unspecified: Secondary | ICD-10-CM | POA: Diagnosis not present

## 2018-03-08 DIAGNOSIS — Z1239 Encounter for other screening for malignant neoplasm of breast: Secondary | ICD-10-CM

## 2018-03-08 DIAGNOSIS — Z23 Encounter for immunization: Secondary | ICD-10-CM | POA: Diagnosis not present

## 2018-03-08 DIAGNOSIS — Z309 Encounter for contraceptive management, unspecified: Secondary | ICD-10-CM | POA: Diagnosis not present

## 2018-03-08 DIAGNOSIS — F329 Major depressive disorder, single episode, unspecified: Secondary | ICD-10-CM

## 2018-03-08 DIAGNOSIS — L508 Other urticaria: Secondary | ICD-10-CM | POA: Diagnosis not present

## 2018-03-08 MED ORDER — ETONOGESTREL-ETHINYL ESTRADIOL 0.12-0.015 MG/24HR VA RING: VAGINAL_RING | VAGINAL | 5 refills | Status: DC

## 2018-03-08 MED ORDER — POLYETHYLENE GLYCOL 3350 17 GM/SCOOP PO POWD: 17.0000 g | Freq: Every day | ORAL | 1 refills | Status: DC

## 2018-03-08 MED ORDER — FLUOXETINE HCL 20 MG PO CAPS: ORAL_CAPSULE | ORAL | 1 refills | Status: DC

## 2018-03-08 MED ORDER — LEVOTHYROXINE SODIUM 150 MCG PO TABS: 150.0000 ug | ORAL_TABLET | Freq: Every day | ORAL | 1 refills | Status: DC

## 2018-03-08 NOTE — Patient Instructions (Addendum)
It was great to see you again today!  Refilled birth control, prozac, and thyroid medicine. Sent in miralax - take it every day Work on taking thyroid medication consistently Schedule physical with me in 6 weeks, come fasting. We will check cholesterol then. Referring to allergist. You can take zyrtec 10mg  daily and zantac 150mg  twice daily until they see you, these may help.  See the handout on how to schedule your mammogram. This is an important test to screen for breast cancer.   Be well, Dr. Pollie MeyerMcIntyre

## 2018-03-08 NOTE — Progress Notes (Signed)
Date of Visit: 03/08/2018   HPI:  Patient presents for routine follow up. Last visit with me was 08/11/16. She had to stop coming here temporarily because of insurance issues, in the mean time [facility] by Dr. Shirlean Mylararol Webb.  Constipation - no bowel movement in 3 weeks. Has gone weeks and weeks for a while. Daughter also has issues with constipation. No blood in stool. Has tried daughter's miralax but a single dose does not yield any results.  Breakouts - breaking out in hives. Stay less than 24 hours. Since October. Very itchy. Taking benadryl which helps a little bit. Various parts of body. Only one in the house with them.  Birth control - using nuvaring for contraception. Out for a month. +sexually active. Using condoms since she's been out. No STD's. One female partner.  Knots on feet - has had knots on the center of her feet that come up occasionally, for months. Not present right now.  Hypothyroidism - taking levothyroxine 150mcg daily but frequently forgets so states she knows her TSH is going to be elevated.  Depression/anxiety - taking prozac 40mg  daily with good reuslts. Denies SI/HI. Feels happy with this dose of medication.   ROS: See HPI.  PMFSH: history of depression, hypothyroidism, anemia  PHYSICAL EXAM: BP 110/70   Pulse 78   Temp 98.3 F (36.8 C) (Oral)   Ht 5\' 2"  (1.575 m)   Wt 150 lb 6.4 oz (68.2 kg)   SpO2 99%   BMI 27.51 kg/m  Gen: no acute distress, pleasant, cooperative, well appearing HEENT: normocephalic, atraumatic, moist mucous membranes  Heart: regular rate and rhythm, no murmur Lungs: clear to auscultation bilaterally, normal work of breathing  Neuro: alert, grossly nonfocal, speech normal Abdomen: soft, nontender to palpation, no masses or organomegaly Skin: small erythematous 1cm raised macules on anterior left chest and part of back without skin breakdown  ASSESSMENT/PLAN:  Health maintenance:  - flu shot given today -  schedule physical in 6 weeks to update pap - will plan to get lipids at that time - discussed options for mammogram with patient, she would like to go ahead and start in her 40s, order entered & patient given instructions on how to schedule - obtain records from interval PCP Dr. Shirlean Mylararol Webb  Depression Well controlled. Continue current regimen.   Hypothyroidism Reviewed tactics for remembering to take it every morning - keep it in a pill box next to her tooth brush, for example. Will defer checking TSH at this time since it is likely to be elevated given partial adherence. Will plan to recheck in 6 weeks when she comes for her physical.  Constipation Likely related to poorly controlled hypothyroidism. Will do miralax 17g scheduled daily, advised may go up to twice a day. Follow up if not improving.  Contraception management Refill nuvaring  Urticaria Chronic urticaria, idiopathic at this time. Recommend scheduled zyrtec 10mg  daily, with option of adding ranitidine 150mg  twice daily. Will refer to allergist by patient request.  Knots on feet - did not address since they are not present currently, advised coming in when they are present so we can take a look & evaluate them.  FOLLOW UP: Follow up in 6 weeks for CPE with pap & labs Referring to allergist  Paula J. Pollie MeyerMcIntyre, MD [facility]

## 2018-03-11 DIAGNOSIS — K5900 Constipation, unspecified: Secondary | ICD-10-CM | POA: Insufficient documentation

## 2018-03-11 DIAGNOSIS — L509 Urticaria, unspecified: Secondary | ICD-10-CM | POA: Insufficient documentation

## 2018-03-11 NOTE — Assessment & Plan Note (Signed)
Likely related to poorly controlled hypothyroidism. Will do miralax 17g scheduled daily, advised may go up to twice a day. Follow up if not improving.

## 2018-03-11 NOTE — Assessment & Plan Note (Signed)
Well-controlled.  Continue current regimen. 

## 2018-03-11 NOTE — Assessment & Plan Note (Signed)
Refill nuvaring

## 2018-03-11 NOTE — Assessment & Plan Note (Signed)
Chronic urticaria, idiopathic at this time. Recommend scheduled zyrtec 10mg  daily, with option of adding ranitidine 150mg  twice daily. Will refer to allergist by patient request.

## 2018-03-11 NOTE — Assessment & Plan Note (Signed)
Reviewed tactics for remembering to take it every morning - keep it in a pill box next to her tooth brush, for example. Will defer checking TSH at this time since it is likely to be elevated given partial adherence. Will plan to recheck in 6 weeks when she comes for her physical.

## 2018-04-04 ENCOUNTER — Ambulatory Visit: Payer: Self-pay | Admitting: Family Medicine

## 2018-04-04 ENCOUNTER — Ambulatory Visit: Payer: Self-pay

## 2018-04-11 ENCOUNTER — Ambulatory Visit
Admission: RE | Admit: 2018-04-11 | Discharge: 2018-04-11 | Disposition: A | Discharge status: Home / Self Care | Payer: Managed Care, Other (non HMO) | Source: Ambulatory Visit | Attending: Family Medicine | Admitting: Family Medicine

## 2018-04-11 DIAGNOSIS — Z1239 Encounter for other screening for malignant neoplasm of breast: Secondary | ICD-10-CM

## 2018-04-12 ENCOUNTER — Ambulatory Visit: Payer: Managed Care, Other (non HMO) | Admitting: Allergy and Immunology

## 2018-04-12 ENCOUNTER — Encounter: Payer: Self-pay | Admitting: Allergy and Immunology

## 2018-04-12 VITALS — BP 108/66 | HR 72 | Temp 97.8°F | Resp 16 | Ht 61.0 in | Wt 150.0 lb

## 2018-04-12 DIAGNOSIS — H1010 Acute atopic conjunctivitis, unspecified eye: Secondary | ICD-10-CM

## 2018-04-12 DIAGNOSIS — L500 Allergic urticaria: Secondary | ICD-10-CM

## 2018-04-12 DIAGNOSIS — J3089 Other allergic rhinitis: Secondary | ICD-10-CM

## 2018-04-12 MED ORDER — MONTELUKAST SODIUM 10 MG PO TABS: 10.0000 mg | ORAL_TABLET | Freq: Every day | ORAL | 5 refills | Status: DC

## 2018-04-12 MED ORDER — RANITIDINE HCL 150 MG PO TABS: 150.0000 mg | ORAL_TABLET | Freq: Two times a day (BID) | ORAL | 5 refills | Status: DC

## 2018-04-12 NOTE — Progress Notes (Signed)
Dear Dr. [name],  Thank you for referring Paula Heath to the [facility] [place] on 04/12/2018.   Below is a summation of this patient's evaluation and recommendations.  Thank you for your referral. I will keep you informed about this patient's response to treatment.   If you have any questions please do not hesitate to contact me.   Sincerely,  Jessica Priest, MD Allergy / Immunology [facility]   ______________________________________________________________________    NEW PATIENT NOTE  Referring Provider: [name], MD Primary Provider: [name], MD Date of office visit: 04/12/2018    Subjective:   Chief Complaint:  Paula Heath (DOB: 1976-08-08) is a 42 y.o. female who presents to the clinic on 04/12/2018 with a chief complaint of Urticaria (onset 09/2017) .     HPI: [name] presents to this clinic in evaluation of hives and allergies.  Since October 2018 she has been having recurrent episodes of red raised itchy lesions across her body that never healed with scar or hyperpigmentation and never last greater than 1 day.  She will have daily outbreaks and occasionally every other day outbreaks.  There has not been any obvious provoking factor that is given rise to this activity.  She has been treated with Zyrtec yet still remains symptomatic even while using Zyrtec on a daily basis.  There is no associated systemic or constitutional symptoms.  In addition, she has a history of developing problems with sneezing and nasal congestion and nose blowing and itchy watery eyes occurring on a perennial basis with springtime exacerbation since her mid 30s.  She does not have a history of anosmia or recurrent headaches or ugly nasal discharge.  She has been given Flonase in the past but she cannot tolerate the use of this medication.  She has been using Zyrtec which does  help somewhat.  She did have a history of childhood asthma but this has completely resolved and she has no bronchospastic symptoms at this point.  Past Medical History:  Diagnosis Date  . Allergy    seasonal  . Anemia    1998  . Asthma    as a child  . Depression 07/01/2012  . Hypothyroidism 08/26/2012  . Thyroid disease   . Ulcer    with bleeding around age 80  . Urticaria     Past Surgical History:  Procedure Laterality Date  . NO PAST SURGERIES      Allergies as of 04/12/2018      Reactions   Benzyl Alcohol Swelling   Nickel Swelling      Medication List      cetirizine 10 MG tablet Commonly known as:  ZYRTEC Take 1 tablet (10 mg total) by mouth daily.   etonogestrel-ethinyl estradiol 0.12-0.015 MG/24HR vaginal ring Commonly known as:  NUVARING INSERT VAGINALLY AND LEAVE IN PLACE FOR 3 CONSECUTIVE WEEKS, THEN REMOVE FOR 1 WEEK   FLUoxetine 20 MG capsule Commonly known as:  PROZAC TAKE TWO CAPSULES BY MOUTH ONCE DAILY   levothyroxine 150 MCG tablet Commonly known as:  SYNTHROID, LEVOTHROID Take 1 tablet (150 mcg total) by mouth daily.   polyethylene glycol powder powder Commonly known as:  GLYCOLAX/MIRALAX Take 17 g by mouth daily.       Review of systems negative except as noted in HPI / PMHx or noted below:  Review of Systems  Constitutional: Negative.   HENT: Negative.  Eyes: Negative.   Respiratory: Negative.   Cardiovascular: Negative.   Gastrointestinal: Negative.   Genitourinary: Negative.   Musculoskeletal: Negative.   Skin: Negative.   Neurological: Negative.   Endo/Heme/Allergies: Negative.   Psychiatric/Behavioral: Negative.     Family History  Problem Relation Age of Onset  . Diabetes Maternal Grandmother   . Thyroid disease Unknown        Aunt deceased of some form of thyroid disease in 2014    Social History   Socioeconomic History  . Marital status: Married    Spouse name: Not on file  . Number of children: Not on file   . Years of education: Not on file  . Highest education level: Not on file  Occupational History  . Not on file  Social Needs  . Financial resource strain: Not on file  . Food insecurity:    Worry: Not on file    Inability: Not on file  . Transportation needs:    Medical: Not on file    Non-medical: Not on file  Tobacco Use  . Smoking status: Never Smoker  . Smokeless tobacco: Never Used  Substance and Sexual Activity  . Alcohol use: Yes  . Drug use: No  . Sexual activity: Yes    Birth control/protection: Implant  Lifestyle  . Physical activity:    Days per week: Not on file    Minutes per session: Not on file  . Stress: Not on file  Relationships  . Social connections:    Talks on phone: Not on file    Gets together: Not on file    Attends religious service: Not on file    Active member of club or organization: Not on file    Attends meetings of clubs or organizations: Not on file    Relationship status: Not on file  . Intimate partner violence:    Fear of current or ex partner: Not on file    Emotionally abused: Not on file    Physically abused: Not on file    Forced sexual activity: Not on file  Other Topics Concern  . Not on file  Social History Narrative  . Not on file    Environmental and Social history  Lives in a townhouse with a dry environment, no animals located inside the household, hardwood on the bedroom floor, plastic on the bed, no plastic on the pillow, and no smokers located inside the household.  She works in an office setting.  Objective:   Vitals:   04/12/18 0834  BP: 108/66  Pulse: 72  Resp: 16  Temp: 97.8 F (36.6 C)   Height:  (154.9 cm) Weight: 150 lb (68 kg)  Physical Exam  HENT:  Head: Normocephalic. Head is without right periorbital erythema and without left periorbital erythema.  Right Ear: Tympanic membrane, external ear and ear canal normal.  Left Ear: Tympanic membrane, external ear and ear canal normal.  Nose:  Mucosal edema present. No rhinorrhea.  Mouth/Throat: Oropharynx is clear and moist and mucous membranes are normal. No oropharyngeal exudate.  Eyes: Pupils are equal, round, and reactive to light. Conjunctivae and lids are normal.  Neck: Trachea normal. No tracheal deviation present. No thyromegaly present.  Cardiovascular: Normal rate, regular rhythm, S1 normal, S2 normal and normal heart sounds.  No murmur heard. Pulmonary/Chest: Effort normal. No stridor. No respiratory distress. She has no wheezes. She has no rales. She exhibits no tenderness.  Abdominal: Soft. She exhibits no distension and no mass. There  is no hepatosplenomegaly. There is no tenderness. There is no rebound and no guarding.  Musculoskeletal: She exhibits no edema or tenderness.  Lymphadenopathy:       Head (right side): No tonsillar adenopathy present.       Head (left side): No tonsillar adenopathy present.    She has no cervical adenopathy.    She has no axillary adenopathy.  Neurological: She is alert.  Skin: No rash noted. She is not diaphoretic. No erythema. No pallor. Nails show no clubbing.    Diagnostics: Allergy skin tests were performed.  She did not demonstrate any hypersensitivity against a screening panel of aeroallergens or foods.  Assessment and Plan:    1. Allergic urticaria   2. Other allergic rhinitis   3. Seasonal allergic conjunctivitis     1.  Allergen avoidance measures?  2.  Use a combination of the following medications:   A.  Cetirizine 10 mg twice a day  B.  Ranitidine 150 mg twice a day  C.  Montelukast 10 mg once a day  D.  OTC Nasacort 1 spray each nostril once a day  3.  Blood - CBC w/diff, CMP, TP, alpha-gal  4.  Can add OTC Benadryl if needed  5.  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  I am going to obtain some blood tests to look at forms of immunological hyperactivity and as well to also check for thyroiditis.  I will see her back in this clinic in 4 weeks or earlier if there is a problem.  Jessica Priest, MD Allergy / Immunology [facility] [place]

## 2018-04-12 NOTE — Patient Instructions (Addendum)
  1.  Allergen avoidance measures?  2.  Use a combination of the following medications:   A.  Cetirizine 10 mg twice a day  B.  Ranitidine 150 mg twice a day  C.  Montelukast 10 mg once a day  D.  OTC Nasacort 1 spray each nostril once a day  3.  Blood - CBC w/diff, CMP, TP, alpha-gal  4.  Can add OTC Benadryl if needed  5.  Return to clinic in 4 weeks or earlier if problem

## 2018-04-13 ENCOUNTER — Encounter: Payer: Self-pay | Admitting: Allergy and Immunology

## 2018-04-13 ENCOUNTER — Telehealth: Payer: Self-pay | Admitting: *Deleted

## 2018-04-13 NOTE — Telephone Encounter (Signed)
Correct lab ordered given to Rachelle, [facility] for add on

## 2018-04-13 NOTE — Addendum Note (Signed)
Addended by: Bennye Alm on: 04/13/2018 04:44 PM   Modules accepted: Orders

## 2018-04-13 NOTE — Telephone Encounter (Signed)
-----   Message from Jessica Priest, MD sent at 04/13/2018  7:48 AM EDT ----- Please note we ordered the wrong test. I requested TP which is Thyroid Peroxidase and it looks like Tryptase was ordered. Please add TP to blood test order.

## 2018-04-15 LAB — CBC WITH DIFFERENTIAL/PLATELET
BASOS ABS: 0 10*3/uL (ref 0.0–0.2)
Basos: 0 %
EOS (ABSOLUTE): 0.2 10*3/uL (ref 0.0–0.4)
Eos: 3 %
Hematocrit: 35.1 % (ref 34.0–46.6)
Hemoglobin: 11.1 g/dL (ref 11.1–15.9)
Immature Grans (Abs): 0 10*3/uL (ref 0.0–0.1)
Immature Granulocytes: 0 %
LYMPHS ABS: 2.2 10*3/uL (ref 0.7–3.1)
Lymphs: 36 %
MCH: 25.9 pg — ABNORMAL LOW (ref 26.6–33.0)
MCHC: 31.6 g/dL (ref 31.5–35.7)
MCV: 82 fL (ref 79–97)
Monocytes Absolute: 0.3 10*3/uL (ref 0.1–0.9)
Monocytes: 5 %
NEUTROS ABS: 3.4 10*3/uL (ref 1.4–7.0)
Neutrophils: 56 %
PLATELETS: 376 10*3/uL (ref 150–379)
RBC: 4.28 x10E6/uL (ref 3.77–5.28)
RDW: 14.8 % (ref 12.3–15.4)
WBC: 6.1 10*3/uL (ref 3.4–10.8)

## 2018-04-15 LAB — ALPHA-GAL PANEL
Alpha Gal IgE*: <0.1 kU/L (ref <0.10)
BEEF CLASS INTERPRETATION: 0
Class Interpretation: 0
Lamb/Mutton (Ovis spp) IgE: <0.1 kU/L (ref <0.35)
PORK CLASS INTERPRETATION: 0
Pork (Sus spp) IgE: <0.1 kU/L (ref <0.35)

## 2018-04-15 LAB — COMPREHENSIVE METABOLIC PANEL
ALK PHOS: 80 IU/L (ref 39–117)
ALT: 9 IU/L (ref 0–32)
AST: 12 IU/L (ref 0–40)
Albumin/Globulin Ratio: 1.1 — ABNORMAL LOW (ref 1.2–2.2)
Albumin: 3.7 g/dL (ref 3.5–5.5)
BILIRUBIN TOTAL: 0.2 mg/dL (ref 0.0–1.2)
BUN/Creatinine Ratio: 20 (ref 9–23)
BUN: 15 mg/dL (ref 6–24)
CHLORIDE: 104 mmol/L (ref 96–106)
CO2: 24 mmol/L (ref 20–29)
Calcium: 9.2 mg/dL (ref 8.7–10.2)
Creatinine, Ser: 0.74 mg/dL (ref 0.57–1.00)
GFR calc Af Amer: 116 mL/min/{1.73_m2} (ref >59)
GFR calc non Af Amer: 101 mL/min/{1.73_m2} (ref >59)
GLUCOSE: 94 mg/dL (ref 65–99)
Globulin, Total: 3.3 g/dL (ref 1.5–4.5)
Potassium: 4.3 mmol/L (ref 3.5–5.2)
Sodium: 139 mmol/L (ref 134–144)
Total Protein: 7 g/dL (ref 6.0–8.5)

## 2018-04-15 LAB — TRYPTASE: Tryptase: 4.7 ug/L (ref 2.2–13.2)

## 2018-04-18 ENCOUNTER — Telehealth: Payer: Self-pay | Admitting: Allergy and Immunology

## 2018-04-18 NOTE — Telephone Encounter (Signed)
Patient is returning a call about results. She is at work, so you may leave a message on her voice mail, or she will be available after 4:30.

## 2018-04-18 NOTE — Telephone Encounter (Signed)
Informed patient of results

## 2018-04-25 LAB — SPECIMEN STATUS REPORT

## 2018-04-25 LAB — THYROID PEROXIDASE ANTIBODY: Thyroperoxidase Ab SerPl-aCnc: >600 IU/mL — ABNORMAL HIGH (ref 0–34)

## 2018-05-10 ENCOUNTER — Ambulatory Visit: Payer: Managed Care, Other (non HMO) | Admitting: Allergy and Immunology

## 2018-05-13 ENCOUNTER — Ambulatory Visit (INDEPENDENT_AMBULATORY_CARE_PROVIDER_SITE_OTHER): Payer: Managed Care, Other (non HMO) | Admitting: Family Medicine

## 2018-05-13 ENCOUNTER — Encounter: Payer: Self-pay | Admitting: Family Medicine

## 2018-05-13 ENCOUNTER — Other Ambulatory Visit (HOSPITAL_COMMUNITY)
Admission: RE | Admit: 2018-05-13 | Discharge: 2018-05-13 | Disposition: A | Discharge status: Home / Self Care | Payer: Managed Care, Other (non HMO) | Source: Ambulatory Visit | Attending: Family Medicine | Admitting: Family Medicine

## 2018-05-13 VITALS — BP 110/70 | HR 73 | Temp 98.5°F | Wt 144.8 lb

## 2018-05-13 DIAGNOSIS — F329 Major depressive disorder, single episode, unspecified: Secondary | ICD-10-CM

## 2018-05-13 DIAGNOSIS — Z13220 Encounter for screening for lipoid disorders: Secondary | ICD-10-CM | POA: Diagnosis not present

## 2018-05-13 DIAGNOSIS — Z113 Encounter for screening for infections with a predominantly sexual mode of transmission: Secondary | ICD-10-CM

## 2018-05-13 DIAGNOSIS — E039 Hypothyroidism, unspecified: Secondary | ICD-10-CM

## 2018-05-13 DIAGNOSIS — Z124 Encounter for screening for malignant neoplasm of cervix: Secondary | ICD-10-CM

## 2018-05-13 DIAGNOSIS — Z309 Encounter for contraceptive management, unspecified: Secondary | ICD-10-CM | POA: Diagnosis not present

## 2018-05-13 DIAGNOSIS — Z0000 Encounter for general adult medical examination without abnormal findings: Secondary | ICD-10-CM

## 2018-05-13 DIAGNOSIS — N841 Polyp of cervix uteri: Secondary | ICD-10-CM | POA: Diagnosis not present

## 2018-05-13 DIAGNOSIS — G44209 Tension-type headache, unspecified, not intractable: Secondary | ICD-10-CM

## 2018-05-13 DIAGNOSIS — F32A Depression, unspecified: Secondary | ICD-10-CM

## 2018-05-13 DIAGNOSIS — K5900 Constipation, unspecified: Secondary | ICD-10-CM

## 2018-05-13 MED ORDER — KETOROLAC TROMETHAMINE 30 MG/ML IJ SOLN
30.0000 mg | Freq: Once | INTRAMUSCULAR | Status: AC
  Administered 2018-05-13: 30 mg via INTRAMUSCULAR

## 2018-05-13 NOTE — Patient Instructions (Addendum)
Toradol shot today for your headache. If worsening over weekend go to urgent care.  Checking labs today Follow up with me in 3 months for depression  Be well, Dr. Ardelia Mems     Health Maintenance, Female Adopting a healthy lifestyle and getting preventive care can go a long way to promote health and wellness. Talk with your health care provider about what schedule of regular examinations is right for you. This is a good chance for you to check in with your provider about disease prevention and staying healthy. In between checkups, there are plenty of things you can do on your own. Experts have done a lot of research about which lifestyle changes and preventive measures are most likely to keep you healthy. Ask your health care provider for more information. Weight and diet Eat a healthy diet  Be sure to include plenty of vegetables, fruits, low-fat dairy products, and lean protein.  Do not eat a lot of foods high in solid fats, added sugars, or salt.  Get regular exercise. This is one of the most important things you can do for your health. ? Most adults should exercise for at least 150 minutes each week. The exercise should increase your heart rate and make you sweat (moderate-intensity exercise). ? Most adults should also do strengthening exercises at least twice a week. This is in addition to the moderate-intensity exercise.  Maintain a healthy weight  Body mass index (BMI) is a measurement that can be used to identify possible weight problems. It estimates body fat based on height and weight. Your health care provider can help determine your BMI and help you achieve or maintain a healthy weight.  For females 70 years of age and older: ? A BMI below 18.5 is considered underweight. ? A BMI of 18.5 to 24.9 is normal. ? A BMI of 25 to 29.9 is considered overweight. ? A BMI of 30 and above is considered obese.  Watch levels of cholesterol and blood lipids  You should start having  your blood tested for lipids and cholesterol at 42 years of age, then have this test every 5 years.  You may need to have your cholesterol levels checked more often if: ? Your lipid or cholesterol levels are high. ? You are older than 42 years of age. ? You are at high risk for heart disease.  Cancer screening Lung Cancer  Lung cancer screening is recommended for adults 40-83 years old who are at high risk for lung cancer because of a history of smoking.  A yearly low-dose CT scan of the lungs is recommended for people who: ? Currently smoke. ? Have quit within the past 15 years. ? Have at least a 30-pack-year history of smoking. A pack year is smoking an average of one pack of cigarettes a day for 1 year.  Yearly screening should continue until it has been 15 years since you quit.  Yearly screening should stop if you develop a health problem that would prevent you from having lung cancer treatment.  Breast Cancer  Practice breast self-awareness. This means understanding how your breasts normally appear and feel.  It also means doing regular breast self-exams. Let your health care provider know about any changes, no matter how small.  If you are in your 20s or 30s, you should have a clinical breast exam (CBE) by a health care provider every 1-3 years as part of a regular health exam.  If you are 10 or older, have a CBE every  year. Also consider having a breast X-ray (mammogram) every year.  If you have a family history of breast cancer, talk to your health care provider about genetic screening.  If you are at high risk for breast cancer, talk to your health care provider about having an MRI and a mammogram every year.  Breast cancer gene (BRCA) assessment is recommended for women who have family members with BRCA-related cancers. BRCA-related cancers include: ? Breast. ? Ovarian. ? Tubal. ? Peritoneal cancers.  Results of the assessment will determine the need for genetic  counseling and BRCA1 and BRCA2 testing.  Cervical Cancer Your health care provider may recommend that you be screened regularly for cancer of the pelvic organs (ovaries, uterus, and vagina). This screening involves a pelvic examination, including checking for microscopic changes to the surface of your cervix (Pap test). You may be encouraged to have this screening done every 3 years, beginning at age 21.  For women ages 30-65, health care providers may recommend pelvic exams and Pap testing every 3 years, or they may recommend the Pap and pelvic exam, combined with testing for human papilloma virus (HPV), every 5 years. Some types of HPV increase your risk of cervical cancer. Testing for HPV may also be done on women of any age with unclear Pap test results.  Other health care providers may not recommend any screening for nonpregnant women who are considered low risk for pelvic cancer and who do not have symptoms. Ask your health care provider if a screening pelvic exam is right for you.  If you have had past treatment for cervical cancer or a condition that could lead to cancer, you need Pap tests and screening for cancer for at least 20 years after your treatment. If Pap tests have been discontinued, your risk factors (such as having a new sexual partner) need to be reassessed to determine if screening should resume. Some women have medical problems that increase the chance of getting cervical cancer. In these cases, your health care provider may recommend more frequent screening and Pap tests.  Colorectal Cancer  This type of cancer can be detected and often prevented.  Routine colorectal cancer screening usually begins at 42 years of age and continues through 42 years of age.  Your health care provider may recommend screening at an earlier age if you have risk factors for colon cancer.  Your health care provider may also recommend using home test kits to check for hidden blood in the  stool.  A small camera at the end of a tube can be used to examine your colon directly (sigmoidoscopy or colonoscopy). This is done to check for the earliest forms of colorectal cancer.  Routine screening usually begins at age 50.  Direct examination of the colon should be repeated every 5-10 years through 42 years of age. However, you may need to be screened more often if early forms of precancerous polyps or small growths are found.  Skin Cancer  Check your skin from head to toe regularly.  Tell your health care provider about any new moles or changes in moles, especially if there is a change in a mole's shape or color.  Also tell your health care provider if you have a mole that is larger than the size of a pencil eraser.  Always use sunscreen. Apply sunscreen liberally and repeatedly throughout the day.  Protect yourself by wearing long sleeves, pants, a wide-brimmed hat, and sunglasses whenever you are outside.  Heart disease, diabetes, and   high blood pressure  High blood pressure causes heart disease and increases the risk of stroke. High blood pressure is more likely to develop in: ? People who have blood pressure in the high end of the normal range (130-139/85-89 mm Hg). ? People who are overweight or obese. ? People who are African American.  If you are 30-37 years of age, have your blood pressure checked every 3-5 years. If you are 67 years of age or older, have your blood pressure checked every year. You should have your blood pressure measured twice-once when you are at a hospital or clinic, and once when you are not at a hospital or clinic. Record the average of the two measurements. To check your blood pressure when you are not at a hospital or clinic, you can use: ? An automated blood pressure machine at a pharmacy. ? A home blood pressure monitor.  If you are between 97 years and 87 years old, ask your health care provider if you should take aspirin to prevent  strokes.  Have regular diabetes screenings. This involves taking a blood sample to check your fasting blood sugar level. ? If you are at a normal weight and have a low risk for diabetes, have this test once every three years after 42 years of age. ? If you are overweight and have a high risk for diabetes, consider being tested at a younger age or more often. Preventing infection Hepatitis B  If you have a higher risk for hepatitis B, you should be screened for this virus. You are considered at high risk for hepatitis B if: ? You were born in a country where hepatitis B is common. Ask your health care provider which countries are considered high risk. ? Your parents were born in a high-risk country, and you have not been immunized against hepatitis B (hepatitis B vaccine). ? You have HIV or AIDS. ? You use needles to inject street drugs. ? You live with someone who has hepatitis B. ? You have had sex with someone who has hepatitis B. ? You get hemodialysis treatment. ? You take certain medicines for conditions, including cancer, organ transplantation, and autoimmune conditions.  Hepatitis C  Blood testing is recommended for: ? Everyone born from [birth year] through 1965. ? Anyone with known risk factors for hepatitis C.  Sexually transmitted infections (STIs)  You should be screened for sexually transmitted infections (STIs) including gonorrhea and chlamydia if: ? You are sexually active and are younger than 42 years of age. ? You are older than 42 years of age and your health care provider tells you that you are at risk for this type of infection. ? Your sexual activity has changed since you were last screened and you are at an increased risk for chlamydia or gonorrhea. Ask your health care provider if you are at risk.  If you do not have HIV, but are at risk, it may be recommended that you take a prescription medicine daily to prevent HIV infection. This is called pre-exposure prophylaxis  (PrEP). You are considered at risk if: ? You are sexually active and do not regularly use condoms or know the HIV status of your partner(s). ? You take drugs by injection. ? You are sexually active with a partner who has HIV.  Talk with your health care provider about whether you are at high risk of being infected with HIV. If you choose to begin PrEP, you should first be tested for HIV. You should then be  tested every 3 months for as long as you are taking PrEP. Pregnancy  If you are premenopausal and you may become pregnant, ask your health care provider about preconception counseling.  If you may become pregnant, take 400 to 800 micrograms (mcg) of folic acid every day.  If you want to prevent pregnancy, talk to your health care provider about birth control (contraception). Osteoporosis and menopause  Osteoporosis is a disease in which the bones lose minerals and strength with aging. This can result in serious bone fractures. Your risk for osteoporosis can be identified using a bone density scan.  If you are 47 years of age or older, or if you are at risk for osteoporosis and fractures, ask your health care provider if you should be screened.  Ask your health care provider whether you should take a calcium or vitamin D supplement to lower your risk for osteoporosis.  Menopause may have certain physical symptoms and risks.  Hormone replacement therapy may reduce some of these symptoms and risks. Talk to your health care provider about whether hormone replacement therapy is right for you. Follow these instructions at home:  Schedule regular health, dental, and eye exams.  Stay current with your immunizations.  Do not use any tobacco products including cigarettes, chewing tobacco, or electronic cigarettes.  If you are pregnant, do not drink alcohol.  If you are breastfeeding, limit how much and how often you drink alcohol.  Limit alcohol intake to no more than 1 drink per day for  nonpregnant women. One drink equals 12 ounces of beer, 5 ounces of wine, or 1 ounces of hard liquor.  Do not use street drugs.  Do not share needles.  Ask your health care provider for help if you need support or information about quitting drugs.  Tell your health care provider if you often feel depressed.  Tell your health care provider if you have ever been abused or do not feel safe at home. This information is not intended to replace advice given to you by your health care provider. Make sure you discuss any questions you have with your health care provider. Document Released: 06/15/2011 Document Revised: 05/07/2016 Document Reviewed: 09/03/2015 Elsevier Interactive Patient Education  [employer].

## 2018-05-13 NOTE — Progress Notes (Signed)
Date of Visit: 05/13/2018   HPI:  Patient presents today for a well woman exam.   Concerns today: headache x3 days, see below Periods: periods monthly Contraception: nuvaring Pelvic symptoms: no vag discharge or pelvic pain Sexual activity: 2 female partners last year, condoms every  STD Screening: desires gc/chl testing and HIV RPR today Pap smear status: due for pap today Exercise: 2-3 times per wek Smoking: no Alcohol: occasional Drugs: no Mood: doing well on prozac, no SI/HI, happy with present dose  Headache - has had frontal headache for about 3 days. Taking ibuprofen but it doesn't help much. No fever, neck stiffness, weakness, numbness, or tingling. History of migraine in the past, last episode was several years ago. No preceding aura. Located on both sides of the front of her head. Drove herself here and plans to go to work after this appointment.   Contraception - uses nuvaring for contraception and is happy with this method. Usually takes out her nuvaring on Tuesdays and menses starts the following Sunday. She went to take it out this past Tuesday and it was not there, thinks it must have fallen out in the toilet. Has not had sex in the last month.  Constipation - doing well with daily miralax.  Hypothyroidism - taking levothyroxine daily. Has been very compliant since last visit, only missed one dose.  ROS: See HPI  PMFSH:  Cancers in family: none that patient is aware of  PHYSICAL EXAM: BP 110/70   Pulse 73   Temp 98.5 F (36.9 C)   Wt 144 lb 12.8 oz (65.7 kg)   LMP 04/10/2018 (Exact Date)   SpO2 100%   BMI 27.36 kg/m  Gen: NAD, pleasant, cooperative HEENT: NCAT, PERRL, no palpable thyromegaly or anterior cervical lymphadenopathy Heart: RRR, no murmurs Lungs: CTAB, NWOB Abdomen: soft, nontender to palpation Neuro: grossly nonfocal, speech normal GU: normal appearing external genitalia without lesions. Vagina is moist with white discharge. Cervix with  small benign-appearing polyp at external os. No cervical motion tenderness or tenderness on bimanual exam. No adnexal masses.   ASSESSMENT/PLAN:  Health maintenance:  -STD screening: gc/chl/trich off pap obtained today, also HIV and RPR -pap smear: done today -mammogram: had normal mammo 04/11/18 -lipid screening: obtain today, patient is fasting -immunizations: UTD -handout given on health maintenance topics  Depression Well controlled. Continue current regimen. Follow up in 3 months.  Hypothyroidism Update TSH today & adjust synthroid as needed.  Contraception management Continue nuvaring. Await start of menses since it apparently fell out early this time. No sex in last month so doubt pregnancy.  Constipation Improved with daily miralax.  Cervical polyp Benign in appearance today. Patient asymptomatic. Will reexamine yearly. No indication for removal at this time. Await pap result.   Headache - normal neuro exam. Suspect migraine. toradol shot given today. Follow up if not improving. Offered work note to patient for today but she wants to go to work after this visit.  FOLLOW UP: Follow up in 3 mos for mood  [place] J. [name], MD [facility]

## 2018-05-14 LAB — LIPID PANEL
CHOL/HDL RATIO: 3.3 ratio (ref 0.0–4.4)
Cholesterol, Total: 158 mg/dL (ref 100–199)
HDL: 48 mg/dL (ref >39)
LDL CALC: 99 mg/dL (ref 0–99)
Triglycerides: 55 mg/dL (ref 0–149)
VLDL Cholesterol Cal: 11 mg/dL (ref 5–40)

## 2018-05-14 LAB — HIV ANTIBODY (ROUTINE TESTING W REFLEX): HIV Screen 4th Generation wRfx: NONREACTIVE

## 2018-05-14 LAB — RPR: RPR: NONREACTIVE

## 2018-05-14 LAB — TSH: TSH: 0.013 u[IU]/mL — ABNORMAL LOW (ref 0.450–4.500)

## 2018-05-16 DIAGNOSIS — N841 Polyp of cervix uteri: Secondary | ICD-10-CM | POA: Insufficient documentation

## 2018-05-16 NOTE — Assessment & Plan Note (Signed)
Continue nuvaring. Await start of menses since it apparently fell out early this time. No sex in last month so doubt pregnancy.

## 2018-05-16 NOTE — Assessment & Plan Note (Signed)
Benign in appearance today. Patient asymptomatic. Will reexamine yearly. No indication for removal at this time. Await pap result.

## 2018-05-16 NOTE — Assessment & Plan Note (Signed)
Well controlled. Continue current regimen. Follow up in  3 months.  

## 2018-05-16 NOTE — Assessment & Plan Note (Signed)
Update TSH today & adjust synthroid as needed.

## 2018-05-16 NOTE — Assessment & Plan Note (Signed)
Improved with daily miralax.

## 2018-05-17 ENCOUNTER — Encounter: Payer: Self-pay | Admitting: Family Medicine

## 2018-05-17 LAB — CYTOLOGY - PAP
Chlamydia: NEGATIVE
Diagnosis: NEGATIVE
HPV (WINDOPATH): NOT DETECTED
NEISSERIA GONORRHEA: NEGATIVE
Trichomonas: NEGATIVE

## 2018-05-20 ENCOUNTER — Telehealth: Payer: Self-pay | Admitting: Family Medicine

## 2018-05-20 DIAGNOSIS — E039 Hypothyroidism, unspecified: Secondary | ICD-10-CM

## 2018-05-20 MED ORDER — LEVOTHYROXINE SODIUM 125 MCG PO TABS: 125.0000 ug | ORAL_TABLET | Freq: Every day | ORAL | 1 refills | Status: DC

## 2018-05-20 NOTE — Telephone Encounter (Signed)
Called patient to discuss lab results from last week. Lipids, STD testing, pap smear all normal. She had googled lipid results and somehow thought she was at risk for prediabetes, which I reassured her was not the case, that her lipids looked great and were normal.  TSH was low. Decrease levothyroxine to 125mcg daily. Recheck TSH in 6 weeks, lab appointment scheduled & order entered.  Also I asked if she had gotten her period yet after not being able to locate nuvaring. She got it yesterday finally. It's been normal flow but says her period is always pretty light. No sex in over 1 month. Advised her to take a HPT just to be on the safe side.  Patient appreciative. Latrelle DodrillBrittany J Crystalle Popwell, MD

## 2018-06-30 ENCOUNTER — Other Ambulatory Visit: Payer: Managed Care, Other (non HMO)

## 2018-06-30 DIAGNOSIS — E039 Hypothyroidism, unspecified: Secondary | ICD-10-CM

## 2018-07-01 ENCOUNTER — Encounter: Payer: Self-pay | Admitting: Family Medicine

## 2018-07-01 ENCOUNTER — Other Ambulatory Visit: Payer: Self-pay

## 2018-07-01 LAB — TSH: TSH: 0.45 u[IU]/mL (ref 0.450–4.500)

## 2018-07-10 ENCOUNTER — Other Ambulatory Visit: Payer: Self-pay | Admitting: Family Medicine

## 2018-08-08 ENCOUNTER — Other Ambulatory Visit: Payer: Self-pay

## 2018-08-08 ENCOUNTER — Ambulatory Visit (INDEPENDENT_AMBULATORY_CARE_PROVIDER_SITE_OTHER): Payer: Managed Care, Other (non HMO)

## 2018-08-08 ENCOUNTER — Encounter (HOSPITAL_COMMUNITY): Payer: Self-pay | Admitting: Emergency Medicine

## 2018-08-08 ENCOUNTER — Ambulatory Visit (HOSPITAL_COMMUNITY)
Admission: EM | Admit: 2018-08-08 | Discharge: 2018-08-08 | Disposition: A | Discharge status: Home / Self Care | Payer: Managed Care, Other (non HMO) | Attending: Family Medicine | Admitting: Family Medicine

## 2018-08-08 DIAGNOSIS — M5489 Other dorsalgia: Secondary | ICD-10-CM | POA: Diagnosis not present

## 2018-08-08 DIAGNOSIS — M546 Pain in thoracic spine: Secondary | ICD-10-CM

## 2018-08-08 DIAGNOSIS — M549 Dorsalgia, unspecified: Secondary | ICD-10-CM | POA: Diagnosis not present

## 2018-08-08 LAB — POCT URINALYSIS DIP (DEVICE)
BILIRUBIN URINE: NEGATIVE
GLUCOSE, UA: NEGATIVE mg/dL
KETONES UR: NEGATIVE mg/dL
LEUKOCYTES UA: NEGATIVE
Nitrite: NEGATIVE
PROTEIN: NEGATIVE mg/dL
Specific Gravity, Urine: 1.025 (ref 1.005–1.030)
Urobilinogen, UA: 1 mg/dL (ref 0.0–1.0)
pH: 7 (ref 5.0–8.0)

## 2018-08-08 MED ORDER — KETOROLAC TROMETHAMINE 60 MG/2ML IM SOLN
INTRAMUSCULAR | Status: AC
  Filled 2018-08-08: qty 2

## 2018-08-08 MED ORDER — CYCLOBENZAPRINE HCL 5 MG PO TABS: 5.0000 mg | ORAL_TABLET | Freq: Every day | ORAL | 0 refills | Status: DC

## 2018-08-08 MED ORDER — OMEPRAZOLE 20 MG PO CPDR: 20.0000 mg | DELAYED_RELEASE_CAPSULE | Freq: Every day | ORAL | 0 refills | Status: DC

## 2018-08-08 MED ORDER — NAPROXEN 500 MG PO TABS: 500.0000 mg | ORAL_TABLET | Freq: Two times a day (BID) | ORAL | 0 refills | Status: DC

## 2018-08-08 MED ORDER — KETOROLAC TROMETHAMINE 60 MG/2ML IM SOLN
60.0000 mg | Freq: Once | INTRAMUSCULAR | Status: AC
  Administered 2018-08-08: 60 mg via INTRAMUSCULAR
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
## 2018-08-08 NOTE — ED Triage Notes (Signed)
Pain in back started Wednesday.  No known injury.  Pain from upper back to lower back and pain has worsened every day.  No pain in buttocks or legs

## 2018-08-08 NOTE — Discharge Instructions (Signed)
Please start naproxen twice a day, take with food.  May take first dose tomorrow morning.  Will also start treatment for reflux as sometimes this can cause upper back pain.  Flexeril at night. May cause drowsiness. Please do not take if driving or drinking alcohol.   Please follow up with your primary care provider in the next week for recheck of your symptoms as if persistent may need further evaluation.  If develop worsening of pain, fevers, chest pain, shortness of breath , cough, dizziness, weakness, please return or go to Er.

## 2018-08-19 ENCOUNTER — Ambulatory Visit: Payer: Managed Care, Other (non HMO) | Admitting: Family Medicine

## 2018-08-19 ENCOUNTER — Other Ambulatory Visit: Payer: Self-pay

## 2018-08-19 ENCOUNTER — Encounter: Payer: Self-pay | Admitting: Family Medicine

## 2018-08-19 VITALS — BP 120/80 | HR 73 | Temp 98.6°F | Ht 61.0 in | Wt 146.0 lb

## 2018-08-19 DIAGNOSIS — M546 Pain in thoracic spine: Secondary | ICD-10-CM

## 2018-08-19 MED ORDER — CYCLOBENZAPRINE HCL 5 MG PO TABS: 5.0000 mg | ORAL_TABLET | Freq: Every day | ORAL | 0 refills | Status: DC

## 2018-08-19 MED ORDER — MELOXICAM 7.5 MG PO TABS: 7.5000 mg | ORAL_TABLET | Freq: Every day | ORAL | 0 refills | Status: DC

## 2018-08-19 NOTE — Patient Instructions (Signed)
Tylenol -2 tablets of the 500 mg THREE times a day   Mobic- once a day- the anti-inflammatory- take with food   Take Flexeril- at night   It was wonderful to see you today.  Thank you for choosing [facility].   Please call [phone] with any questions about today's appointment.  Please be sure to schedule follow up at the front  desk before you leave today.   Terisa Starr, MD  Family Medicine
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
## 2018-08-23 ENCOUNTER — Ambulatory Visit (HOSPITAL_COMMUNITY)
Admission: RE | Admit: 2018-08-23 | Discharge: 2018-08-23 | Disposition: A | Discharge status: Home / Self Care | Payer: Managed Care, Other (non HMO) | Source: Ambulatory Visit | Attending: Family Medicine | Admitting: Family Medicine

## 2018-08-23 DIAGNOSIS — M546 Pain in thoracic spine: Secondary | ICD-10-CM | POA: Diagnosis present

## 2018-08-24 ENCOUNTER — Telehealth: Payer: Self-pay | Admitting: Family Medicine

## 2018-08-24 DIAGNOSIS — R32 Unspecified urinary incontinence: Secondary | ICD-10-CM

## 2018-08-24 DIAGNOSIS — M546 Pain in thoracic spine: Secondary | ICD-10-CM

## 2018-08-24 DIAGNOSIS — R52 Pain, unspecified: Secondary | ICD-10-CM

## 2018-08-24 DIAGNOSIS — M5440 Lumbago with sciatica, unspecified side: Secondary | ICD-10-CM

## 2018-08-24 MED ORDER — MELOXICAM 15 MG PO TABS: 15.0000 mg | ORAL_TABLET | Freq: Every day | ORAL | 0 refills | Status: DC

## 2018-08-24 NOTE — Telephone Encounter (Signed)
Pt informed. Deseree Blount, CMA  

## 2018-08-24 NOTE — Telephone Encounter (Signed)
Attempted to call pt no answer. Scheduled her MRI for 08/31/18 @ 9am at [facility] cone/1st floor radiology. Please inform if she calls back. Giulian Goldring Bruna Potter, CMA

## 2018-08-24 NOTE — Telephone Encounter (Signed)
The anti-inflamatory medicine (Meloxicam) she was prescribed is not enough for the pain.  Can she get something stronger.  She is so much pain she cannot sleep.  She also wants results from x-ray.

## 2018-08-24 NOTE — Telephone Encounter (Signed)
Return patient's call.  Patient continues to have significant pain.  Her right leg continues to have weakness at work.  She is having uncontrollable nocturnal pain despite taking meloxicam, Tylenol and cyclobenzaprine.  She continues to have intermittent uncontrolled urinary episodes.  Denies fevers, falls, trauma.  We discussed this at length.  Recommended proceeding with MRI.  This is been ordered.  Increase Mobic to 15 mg.  Recommended that she can increase her Flexeril at night only if she does not drink any alcohol we discussed the reasons to return to care at length we discussed the reasons to present to the emergency department.  All questions were answered.

## 2018-08-31 ENCOUNTER — Ambulatory Visit (HOSPITAL_COMMUNITY): Payer: Managed Care, Other (non HMO)

## 2018-09-09 ENCOUNTER — Encounter: Payer: Self-pay | Admitting: Family Medicine

## 2018-09-09 ENCOUNTER — Ambulatory Visit: Payer: Managed Care, Other (non HMO) | Admitting: Family Medicine

## 2018-09-09 VITALS — BP 118/80 | HR 80 | Temp 98.6°F | Wt 149.8 lb

## 2018-09-09 DIAGNOSIS — M5440 Lumbago with sciatica, unspecified side: Secondary | ICD-10-CM

## 2018-09-09 DIAGNOSIS — Z3044 Encounter for surveillance of vaginal ring hormonal contraceptive device: Secondary | ICD-10-CM | POA: Diagnosis not present

## 2018-09-09 MED ORDER — ETONOGESTREL-ETHINYL ESTRADIOL 0.12-0.015 MG/24HR VA RING: VAGINAL_RING | VAGINAL | 4 refills | Status: DC

## 2018-09-09 NOTE — Patient Instructions (Addendum)
Discontinue Meloxicam--try every other day or every two days then stop it   Try logging your foods--[employer] Pal or similar  It was wonderful to see you today.  Thank you for choosing [facility].   Please call [phone] with any questions about today's appointment.  Please be sure to schedule follow up at the front  desk before you leave today.   Terisa Starr, MD  Family Medicine

## 2018-09-09 NOTE — Progress Notes (Signed)
  Patient Name: Paula Heath Date of Birth: 1976-02-12 Date of Visit: 09/09/18 PCP: [name], MD  Chief Complaint: Back pain follow-up  Subjective: Paula Heath is a pleasant 42 y.o. year old with history of hypothyroidism presenting today for follow-up for her back pain.  Paula Heath presents today for follow-up for her back pain.  She reports this is markedly improved.  Her urinary incontinence is also improved.  She did not her MRI.  She plans to forego this given her back pain and symptoms have improved.  She specifically denies urinary incontinence loss of bowel function, numbness or weakness in her lower extremities.  She is still planning to get a standing desk at work.  She works at [employer] and spends a considerable amount of time sitting down during the day.  She is still using meloxicam.  She is stopped Tylenol and Flexeril.  Paula Heath requests a refill on her NuvaRing.  She is a non-smoker.  She has no personal or family history of venous trauma embolism.  She denies a history of migraines with aura.  She currently has a NuvaRing in place.  She denies signs or symptoms of pregnancy.  She is currently sexually active with one partner.   ROS:  ROS Negative except for as above. I have reviewed the patient's medical, surgical, family, and social history as appropriate.   Vitals:   09/09/18 0834  BP: 118/80  Pulse: 80  Temp: 98.6 F (37 C)  SpO2: 100%   Filed Weights   09/09/18 0834  Weight: 149 lb 12.8 oz (67.9 kg)  HEENT: Sclera anicteric. Dentition is moderate. Appears well hydrated. Neck: Supple Cardiac: Regular rate and rhythm. Normal S1/S2. No murmurs, rubs, or gallops appreciated. Lungs: Clear bilaterally to ascultation.  MSK: Exam notable for markedly improved gait.  Gait is within normal limits.  No tenderness along the thoracic lumbar processes or paraspinal muscles.  No SI joint tenderness.   Anahita was seen today for back pain.  Diagnoses  and all orders for this visit:  Acute bilateral low back pain with sciatica, sciatica laterality unspecified improved we discussed that if his this returns I would recommend an MRI.  She is to discontinue meloxicam slowly.  Reviewed reasons to return and call.  Encounter for surveillance of vaginal ring hormonal contraceptive device -     etonogestrel-ethinyl estradiol (NUVARING) 0.12-0.015 MG/24HR vaginal ring; INSERT VAGINALLY AND LEAVE IN PLACE FOR 3 CONSECUTIVE WEEKS, THEN REMOVE FOR 1 WEEK    Terisa Starr, MD  Family Medicine Teaching Service

## 2019-01-26 NOTE — Telephone Encounter (Signed)
error 

## 2019-05-10 ENCOUNTER — Other Ambulatory Visit: Payer: Self-pay | Admitting: Family Medicine

## 2019-05-10 DIAGNOSIS — Z1231 Encounter for screening mammogram for malignant neoplasm of breast: Secondary | ICD-10-CM

## 2019-05-26 ENCOUNTER — Ambulatory Visit: Payer: Managed Care, Other (non HMO) | Admitting: Family Medicine

## 2019-05-26 ENCOUNTER — Other Ambulatory Visit: Payer: Self-pay

## 2019-05-26 VITALS — BP 110/80 | HR 85

## 2019-05-26 DIAGNOSIS — M25562 Pain in left knee: Secondary | ICD-10-CM

## 2019-05-26 DIAGNOSIS — Z3044 Encounter for surveillance of vaginal ring hormonal contraceptive device: Secondary | ICD-10-CM

## 2019-05-26 DIAGNOSIS — M25561 Pain in right knee: Secondary | ICD-10-CM | POA: Diagnosis not present

## 2019-05-26 DIAGNOSIS — E039 Hypothyroidism, unspecified: Secondary | ICD-10-CM | POA: Diagnosis not present

## 2019-05-26 DIAGNOSIS — K5900 Constipation, unspecified: Secondary | ICD-10-CM

## 2019-05-26 MED ORDER — ETONOGESTREL-ETHINYL ESTRADIOL 0.12-0.015 MG/24HR VA RING: VAGINAL_RING | VAGINAL | 4 refills | Status: DC

## 2019-05-26 MED ORDER — METHYLPREDNISOLONE ACETATE 80 MG/ML IJ SUSP
40.0000 mg | Freq: Once | INTRAMUSCULAR | Status: AC
  Administered 2019-05-26: 40 mg via INTRA_ARTICULAR

## 2019-05-26 MED ORDER — POLYETHYLENE GLYCOL 3350 17 GM/SCOOP PO POWD: 17.0000 g | Freq: Every day | ORAL | 1 refills | Status: DC

## 2019-05-26 NOTE — Assessment & Plan Note (Signed)
Likely related to inadequate activity lately, also may have some component of early arthritis given remote history of patellar dislocations Bilateral knees injected today with good improvement in symptoms If not continuing to improve would get xrays

## 2019-05-26 NOTE — Patient Instructions (Addendum)
It was great to see you again today!  Refilled your birth control Shots in both knees today Call if not improving and we can check xrays or send you to a specialist  Checking thyroid today Call if headaches don't improve with eating more regularly  Be well, Dr. Ardelia Mems

## 2019-05-26 NOTE — Progress Notes (Signed)
Date of Visit: 05/26/2019   HPI:  Patient presents to discuss the following:  Knees - has been working from home since April due to [place] pandemic. Since that time has been more physically inactive and cannot stand at her desk, which had been helping her body feel better while working. Knees hurt much of the time, regardless of activity level. History of bilateral patellar dislocations years ago. No recent xrays. Tried ibuprofen without much help. No redness, swelling/warmth to the knees. No preceding injuries recently.  Hands burning - palms of her hands are burning bilaterally. In the past when she's had this her thyroid level was off. Due for TSH check. Currently taking levothyroxine daily  Headaches - don't bother her much, her daughter wanted her to mention them. Has headache about 3 times a week, responds well to tylenol and eating food. Thinks they may be from not eating regularly. No numbness/weakness anywhere. Vision changes or speech changes. Headaches have gone on for about 6 months, not worsening.  ROS: See HPI.  [place]: history of depression, hypothyroidism  PHYSICAL EXAM: BP 110/80   Pulse 85   SpO2 98%  Gen: no acute distress, pleasant, cooperative, well appearing HEENT: normocephalic, atraumatic  Lungs: normal work of breathing Neuro: alert, grossly nonfocal, speech normal, gait normal Ext: bilateral knees without effusion, warmth, or tenderness. Full ROM. Full strenght. MCL and LCL intact to varus and valgus stressing bilaterally. Normal patellar mobility.  PROCEDURE NOTE:  After informed written consent was obtained, patient was seated on exam table. R knee was prepped with alcohol swab. Utilizing anterolateral approach, patient's right knee was injected intraarticularly with mixture of 0.5cc of depomedrol 80mg /mL (for 40mg  total) and 4cc of 1% lidocaine without epinephrine.   Attention them turned to left knee, which was prepped with alcohol swab. Utilizing  anterolateral approach, patient's left knee was injected intraarticularly with mixture of 0.5cc of depomedrol 80mg /mL (for 40mg  total) and 4cc of 1% lidocaine without epinephrine. Patient tolerated the procedure well without immediate complications. Given post-procedure instructions.    ASSESSMENT/PLAN:  Health maintenance:  -current on HM items  Bilateral knee pain Likely related to inadequate activity lately, also may have some component of early arthritis given remote history of patellar dislocations Bilateral knees injected today with good improvement in symptoms If not continuing to improve would get xrays  Contraception management Refilled nuvaring  Hypothyroidism Check TSH - in past symptoms of hands burning has been related to thyroid medication needing adjustment  Constipation Refill miralax  Headache - responds well to tylenol, encouraged eating more regularly to promote blood sugar stabilization which will hopefully help prevent headaches  FOLLOW UP: Follow up as needed if symptoms worsen or do not improve.   [place]. Ardelia Mems, [place]

## 2019-05-26 NOTE — Assessment & Plan Note (Signed)
Check TSH - in past symptoms of hands burning has been related to thyroid medication needing adjustment

## 2019-05-26 NOTE — Assessment & Plan Note (Signed)
Refilled nuva ring ?

## 2019-05-26 NOTE — Assessment & Plan Note (Signed)
Refill miralax

## 2019-05-27 LAB — TSH: TSH: 1.3 u[IU]/mL (ref 0.450–4.500)

## 2019-06-05 ENCOUNTER — Other Ambulatory Visit: Payer: Self-pay | Admitting: Family Medicine

## 2019-06-05 ENCOUNTER — Telehealth: Payer: Self-pay

## 2019-06-05 MED ORDER — LEVOTHYROXINE SODIUM 125 MCG PO TABS: 125.0000 ug | ORAL_TABLET | Freq: Every day | ORAL | 3 refills | Status: DC

## 2019-06-05 NOTE — Telephone Encounter (Signed)
Attempted to call patient with results of TSH and RX information.  No answer. LVM for patient to return call.  Paula Heath, [place]

## 2019-06-26 ENCOUNTER — Ambulatory Visit
Admission: RE | Admit: 2019-06-26 | Discharge: 2019-06-26 | Disposition: A | Discharge status: Home / Self Care | Payer: Managed Care, Other (non HMO) | Source: Ambulatory Visit | Attending: Family Medicine | Admitting: Family Medicine

## 2019-06-26 DIAGNOSIS — Z1231 Encounter for screening mammogram for malignant neoplasm of breast: Secondary | ICD-10-CM

## 2020-01-19 ENCOUNTER — Other Ambulatory Visit: Payer: Self-pay

## 2020-01-19 MED ORDER — LEVOTHYROXINE SODIUM 125 MCG PO TABS: 125.0000 ug | ORAL_TABLET | Freq: Every day | ORAL | 3 refills | Status: DC

## 2020-01-26 ENCOUNTER — Telehealth: Payer: Self-pay

## 2020-01-26 NOTE — Telephone Encounter (Signed)
Patient calls nurse line regarding issues with getting synthroid filled. Called pharmacy and they state that the Sandos brand of synthroid that is usually prescribed is on back order and they need a verbal okay to fill the medication in the alvogen brand.   Dr. [name] approved of switch, pharmacy notified. Patient informed.   Veronda Prude, RN

## 2020-04-30 ENCOUNTER — Telehealth: Payer: Self-pay

## 2020-04-30 ENCOUNTER — Other Ambulatory Visit: Payer: Self-pay

## 2020-04-30 ENCOUNTER — Encounter: Payer: Self-pay | Admitting: Family Medicine

## 2020-04-30 ENCOUNTER — Ambulatory Visit (INDEPENDENT_AMBULATORY_CARE_PROVIDER_SITE_OTHER): Payer: Managed Care, Other (non HMO) | Admitting: Family Medicine

## 2020-04-30 VITALS — BP 115/70 | HR 72 | Ht 62.0 in | Wt 152.4 lb

## 2020-04-30 DIAGNOSIS — R519 Headache, unspecified: Secondary | ICD-10-CM

## 2020-04-30 DIAGNOSIS — E039 Hypothyroidism, unspecified: Secondary | ICD-10-CM

## 2020-04-30 DIAGNOSIS — G4452 New daily persistent headache (NDPH): Secondary | ICD-10-CM | POA: Diagnosis not present

## 2020-04-30 DIAGNOSIS — G8929 Other chronic pain: Secondary | ICD-10-CM | POA: Diagnosis not present

## 2020-04-30 MED ORDER — AMITRIPTYLINE HCL 10 MG PO TABS: 10.0000 mg | ORAL_TABLET | Freq: Every day | ORAL | 1 refills | Status: DC

## 2020-04-30 NOTE — Telephone Encounter (Signed)
Called patient to inform her of appointment for MR of Head.  Time was not convenient for patient.  Gave her the number for scheduling so that she can call and schedule at her earliest convenience.  Glennie Hawk, CMA

## 2020-04-30 NOTE — Patient Instructions (Signed)
Checking thyroid today We will call with an MRI appointment Start amitryptiline 10mg  daily at night  Follow up with me in 1 month  Be well, Dr. Ardelia Mems    Tension Headache, Adult A tension headache is a feeling of pain, pressure, or aching in the head that is often felt over the front and sides of the head. The pain can be dull, or it can feel tight (constricting). There are two types of tension headache:  Episodic tension headache. This is when the headaches happen fewer than 15 days a month.  Chronic tension headache. This is when the headaches happen more than 15 days a month during a 69-month period. A tension headache can last from 30 minutes to several days. It is the most common kind of headache. Tension headaches are not normally associated with nausea or vomiting, and they do not get worse with physical activity. What are the causes? The exact cause of this condition is not known. Tension headaches are often triggered by stress, anxiety, or depression. Other triggers include:  Alcohol.  Too much caffeine or caffeine withdrawal.  Respiratory infections, such as colds, flu, or sinus infections.  Dental problems or teeth clenching.  Tiredness (fatigue).  Holding your head and neck in the same position for a long period of time, such as while using a computer.  Smoking.  Arthritis of the neck. What are the signs or symptoms? Symptoms of this condition include:  A feeling of pressure or tightness around the head.  Dull, aching head pain.  Pain over the front and sides of the head.  Tenderness in the muscles of the head, neck, and shoulders. How is this diagnosed? This condition may be diagnosed based on your symptoms, your medical history, and a physical exam. If your symptoms are severe or unusual, you may have imaging tests, such as a CT scan or an MRI of your head. Your vision may also be checked. How is this treated? This condition may be treated with  lifestyle changes and with medicines that help relieve symptoms. Follow these instructions at home: Managing pain  Take over-the-counter and prescription medicines only as told by your health care provider.  When you have a headache, lie down in a dark, quiet room.  If directed, apply ice to the head and neck: ? Put ice in a plastic bag. ? Place a towel between your skin and the bag. ? Leave the ice on for 20 minutes, 2-3 times a day.  If directed, apply heat to the back of your neck as often as told by your health care provider. Use the heat source that your health care provider recommends, such as a moist heat pack or a heating pad. ? Place a towel between your skin and the heat source. ? Leave the heat on for 20-30 minutes. ? Remove the heat if your skin turns Bodkins red. This is especially important if you are unable to feel pain, heat, or cold. You may have a greater risk of getting burned. Eating and drinking  Eat meals on a regular schedule.  Limit alcohol intake to no more than 1 drink a day for nonpregnant women and 2 drinks a day for men. One drink equals 12 oz of beer, 5 oz of wine, or 1 oz of hard liquor.  Drink enough fluid to keep your urine pale yellow.  Decrease your caffeine intake, or stop using caffeine. Lifestyle  Get 7-9 hours of sleep each night, or get the amount of sleep  recommended by your health care provider.  At bedtime, remove all electronic devices from your room. Electronic devices include computers, phones, and tablets.  Find ways to manage your stress. Some things that can help relieve stress include: ? Exercise. ? Deep breathing exercises. ? Yoga. ? Listening to music. ? Positive mental imagery.  Try to sit up straight and avoid tensing your muscles.  Do not use any products that contain nicotine or tobacco, such as cigarettes and e-cigarettes. If you need help quitting, ask your health care provider. General instructions   Keep all  follow-up visits as told by your health care provider. This is important.  Avoid any headache triggers. Keep a headache journal to help find out what may trigger your headaches. For example, write down: ? What you eat and drink. ? How much sleep you get. ? Any change to your diet or medicines. Contact a health care provider if:  Your headache does not get better.  Your headache comes back.  You are sensitive to sounds, light, or smells because of a headache.  You have nausea or you vomit.  Your stomach hurts. Get help right away if:  You suddenly develop a very severe headache along with any of the following: ? A stiff neck. ? Nausea and vomiting. ? Confusion. ? Weakness. ? Double vision or loss of vision. ? Shortness of breath. ? Rash. ? Unusual sleepiness. ? Fever. ? Trouble speaking. ? Pain in your eyes or ears. ? Trouble walking or balancing. ? Feeling faint or passing out. Summary  A tension headache is a feeling of pain, pressure, or aching in the head that is often felt over the front and sides of the head.  A tension headache can last from 30 minutes to several days. It is the most common kind of headache.  This condition may be diagnosed based on your symptoms, your medical history, and a physical exam.  This condition may be treated with lifestyle changes and with medicines that help relieve symptoms. This information is not intended to replace advice given to you by your health care provider. Make sure you discuss any questions you have with your health care provider. Document Revised: 09/27/2019 Document Reviewed: 03/12/2017 Elsevier Patient Education  2020 [employer].

## 2020-04-30 NOTE — Progress Notes (Signed)
  Date of Visit: 04/30/2020   SUBJECTIVE:   HPI:  Paula Heath presents today for headaches.  Headache - has had ongoing headaches for about 2 months. Located on either side of head near temples. Occurring daily, lasting several hours at a time. Not worse with bending or straining. Not waking up with headache. Occurs later in the day. Takes tylenol and over the counter migraine medication without a lot of relief. Endorses photophobia with the headaches. No nausea. No vision changes. Saw her eye doctor a few weeks ago who said her eyes looked good. Does have history of one migraine in the past, years ago, cannot remember how it felt compared to her current headaches. Also endorses having trouble sleeping. She can fall asleep okay, just cannot stay asleep. She is on nuvaring for birth control.  Hypothyroidism - currently taking levothyroxine daily. Due for TSH check  OBJECTIVE:   BP 115/70   Pulse 72   Ht 5\' 2"  (1.575 m)   Wt 152 lb 6.4 oz (69.1 kg)   LMP 04/30/2020   SpO2 100%   BMI 27.87 kg/m  Gen: no acute distress, pleasant, cooperative HEENT: normocephalic, atraumatic, pupils equal round and reactive to light. extraocular movements intact. Oropharynx clear and moist. Tonsils not enlarged Heart: regular rate and rhythm  Lungs: normal work of breathing  Neuro: cranial nerves II-XII tested and intact. Speech normal. Full strength bilat upper and lower ext. Normal FNF. Sensation intact over upper & lower extremities   ASSESSMENT/PLAN:   Health maintenance:  -current on HM items -has had both doses of COVID vaccine  Headache Suspect tension headache, but new daily persistent headaches in a patient who is on combined contraception warrants further evaluation. Normal neuro exam. Discussed options for evaluation with patient, including MRI/MRV. I favor getting MRI/MRV given her estrogen use - this will be much more reassuring if normal. In the meantime, start low dose amitriptyline  at bedtime as headache prevention. Should also help with sleep. Follow up in 1 month to assess for improvement.  Hypothyroidism Check TSH today - adjust levothyroxine as needed based on results  FOLLOW UP: Follow up in 1 mo for headaches  05/02/2020 J. [place], MD [facility]

## 2020-05-01 ENCOUNTER — Encounter: Payer: Self-pay | Admitting: Family Medicine

## 2020-05-01 LAB — TSH: TSH: 0.813 u[IU]/mL (ref 0.450–4.500)

## 2020-05-02 NOTE — Assessment & Plan Note (Signed)
Check TSH today - adjust levothyroxine as needed based on results

## 2020-05-02 NOTE — Assessment & Plan Note (Addendum)
Suspect tension headache, but new daily persistent headaches in a patient who is on combined contraception warrants further evaluation. Normal neuro exam. Discussed options for evaluation with patient, including MRI/MRV. I favor getting MRI/MRV given her estrogen use - this will be much more reassuring if normal. In the meantime, start low dose amitriptyline at bedtime as headache prevention. Should also help with sleep. Follow up in 1 month to assess for improvement.

## 2020-05-15 ENCOUNTER — Other Ambulatory Visit: Payer: Self-pay | Admitting: Family Medicine

## 2020-05-15 DIAGNOSIS — Z1231 Encounter for screening mammogram for malignant neoplasm of breast: Secondary | ICD-10-CM

## 2020-05-18 ENCOUNTER — Ambulatory Visit (HOSPITAL_COMMUNITY): Payer: Managed Care, Other (non HMO)

## 2020-05-25 ENCOUNTER — Ambulatory Visit (HOSPITAL_COMMUNITY)
Admission: RE | Admit: 2020-05-25 | Discharge: 2020-05-25 | Disposition: A | Discharge status: Home / Self Care | Payer: Managed Care, Other (non HMO) | Source: Ambulatory Visit | Attending: Family Medicine | Admitting: Family Medicine

## 2020-05-25 ENCOUNTER — Other Ambulatory Visit: Payer: Self-pay

## 2020-05-25 DIAGNOSIS — G4452 New daily persistent headache (NDPH): Secondary | ICD-10-CM | POA: Diagnosis present

## 2020-05-27 ENCOUNTER — Other Ambulatory Visit: Payer: Self-pay | Admitting: Family Medicine

## 2020-05-27 ENCOUNTER — Encounter: Payer: Self-pay | Admitting: Family Medicine

## 2020-05-27 DIAGNOSIS — Z3044 Encounter for surveillance of vaginal ring hormonal contraceptive device: Secondary | ICD-10-CM

## 2020-06-03 NOTE — Progress Notes (Signed)
  Date of Visit: 06/04/2020   SUBJECTIVE:   HPI:  Paula Heath presents today for follow up of headaches.  Headache - last visit was started on amitriptyline 10mg  at night for headaches. Had MRI/MRV which was negative. Since starting the amitryptyline she has done quite well. When she takes the medication, does not get a headache anymore. Is very happy with this medication. Helped her sleep initially, now not so much but she prefers to stay on this dose.  OBJECTIVE:   BP 115/80   Pulse 100   Ht 5\' 2"  (1.575 m)   Wt 152 lb 9.6 oz (69.2 kg)   LMP 06/02/2020 (Exact Date)   SpO2 100%   BMI 27.91 kg/m  Gen: no acute distress, pleasant, cooperative HEENT: normocephalic, atraumatic Heart: regular rate and rhythm, no murmur Lungs: normal work of breathing  Neuro: speech normal, grossly nonfocal  ASSESSMENT/PLAN:   Health maintenance:  -hep C antibody done today -otherwise UTD on HM items  Headache Greatly improved with amitriptyline. Continue this medication. Follow up in 1 year, sooner if headaches worsening or changing.  FOLLOW UP: Follow up in 1 year, sooner if needed  J. 06/04/2020, MD [facility]

## 2020-06-04 ENCOUNTER — Other Ambulatory Visit: Payer: Self-pay

## 2020-06-04 ENCOUNTER — Ambulatory Visit (INDEPENDENT_AMBULATORY_CARE_PROVIDER_SITE_OTHER): Payer: Managed Care, Other (non HMO) | Admitting: Family Medicine

## 2020-06-04 ENCOUNTER — Encounter: Payer: Self-pay | Admitting: Family Medicine

## 2020-06-04 VITALS — BP 115/80 | HR 100 | Ht 62.0 in | Wt 152.6 lb

## 2020-06-04 DIAGNOSIS — G8929 Other chronic pain: Secondary | ICD-10-CM

## 2020-06-04 DIAGNOSIS — Z1159 Encounter for screening for other viral diseases: Secondary | ICD-10-CM

## 2020-06-04 DIAGNOSIS — R519 Headache, unspecified: Secondary | ICD-10-CM

## 2020-06-04 MED ORDER — AMITRIPTYLINE HCL 10 MG PO TABS: 10.0000 mg | ORAL_TABLET | Freq: Every day | ORAL | 3 refills | Status: DC

## 2020-06-04 NOTE — Patient Instructions (Addendum)
Continue amitriptyline Hepatitis C test today  Follow up with me in 1 year, sooner if headaches worsening  Be well, Dr. [name]

## 2020-06-04 NOTE — Assessment & Plan Note (Signed)
Greatly improved with amitriptyline. Continue this medication. Follow up in 1 year, sooner if headaches worsening or changing.

## 2020-06-05 ENCOUNTER — Encounter: Payer: Self-pay | Admitting: Family Medicine

## 2020-06-05 LAB — HEPATITIS C ANTIBODY (REFLEX): HCV Ab: <0.1 s/co ratio (ref 0.0–0.9)

## 2020-06-05 LAB — HCV COMMENT:

## 2020-06-14 ENCOUNTER — Ambulatory Visit
Admission: RE | Admit: 2020-06-14 | Discharge: 2020-06-14 | Disposition: A | Discharge status: Home / Self Care | Payer: Managed Care, Other (non HMO) | Source: Ambulatory Visit

## 2020-06-14 ENCOUNTER — Other Ambulatory Visit: Payer: Self-pay

## 2020-06-14 DIAGNOSIS — Z1231 Encounter for screening mammogram for malignant neoplasm of breast: Secondary | ICD-10-CM

## 2020-06-28 ENCOUNTER — Ambulatory Visit
Admission: RE | Admit: 2020-06-28 | Discharge: 2020-06-28 | Disposition: A | Discharge status: Home / Self Care | Payer: Managed Care, Other (non HMO) | Source: Ambulatory Visit | Attending: Family Medicine | Admitting: Family Medicine

## 2020-06-28 ENCOUNTER — Other Ambulatory Visit: Payer: Self-pay

## 2020-09-15 IMAGING — MR MR MRV HEAD W/O CM
1 series · 48 of 48 positions shown · non-contrast
Comparison: Report of noncontrast head CT 02/25/2001 (no images
available).

CLINICAL DATA: 43-year-old female with headache.

EXAM:
MR VENOGRAM OF THE HEAD WITHOUT CONTRAST
TECHNIQUE: Angiographic images of the intracranial venous structures were
obtained using MRV technique without intravenous contrast.

[Series 5: [id number] · coronal · 2.0mm · 0.98mm/px · 48 of 138 slices shown]
[im 1/138]
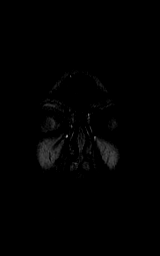
[im 3/138]
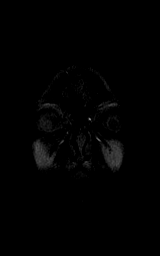
[im 6/138]
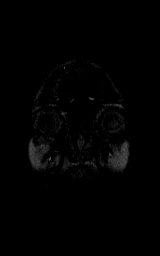
[im 9/138]
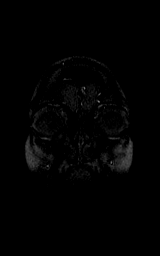
[im 12/138]
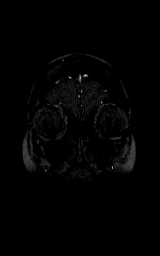
[im 15/138]
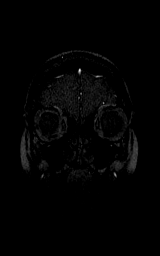
[im 18/138]
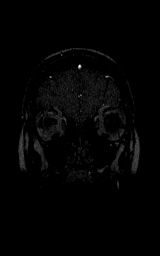
[im 21/138]
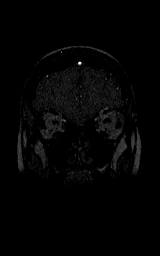
[im 24/138]
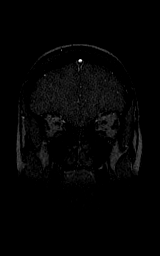
[im 27/138]
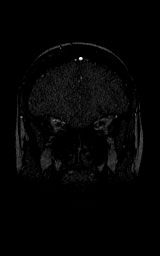
[im 30/138]
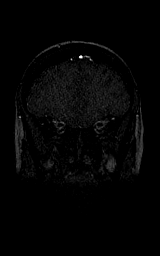
[im 33/138]
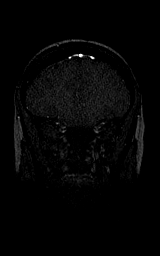
[im 35/138]
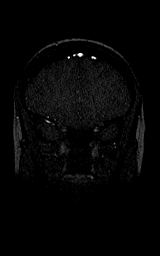
[im 38/138]
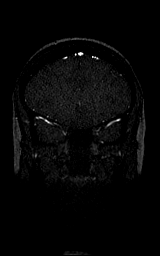
[im 41/138]
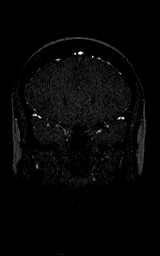
[im 44/138]
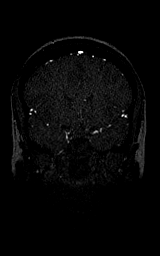
[im 47/138]
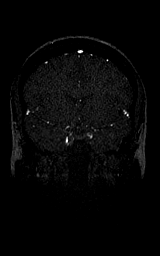
[im 50/138]
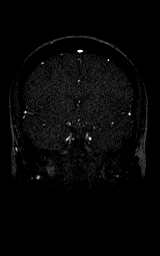
[im 53/138]
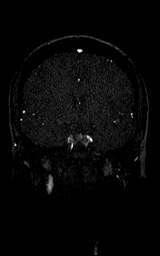
[im 56/138]
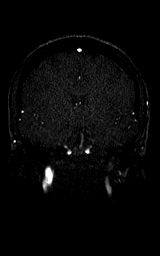
[im 59/138]
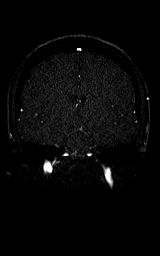
[im 62/138]
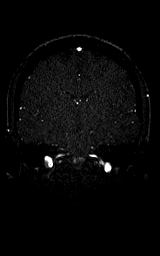
[im 65/138]
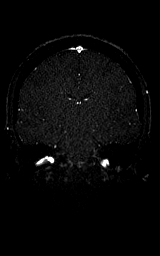
[im 68/138]
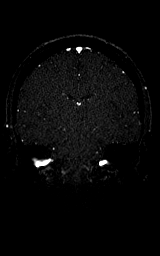
[im 70/138]
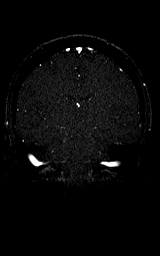
[im 73/138]
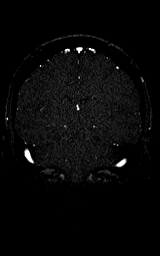
[im 76/138]
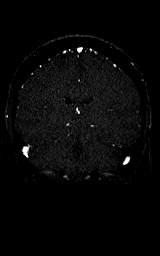
[im 79/138]
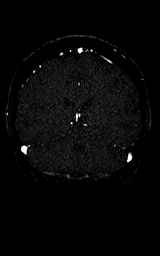
[im 82/138]
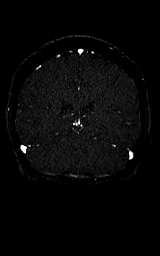
[im 85/138]
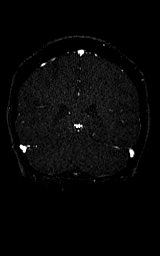
[im 88/138]
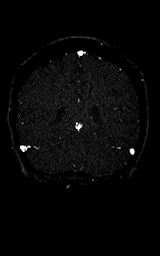
[im 91/138]
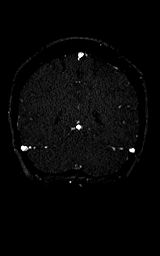
[im 94/138]
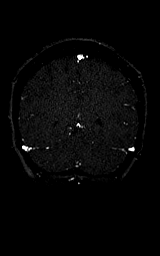
[im 97/138]
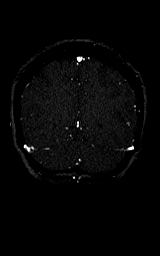
[im 100/138]
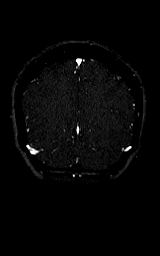
[im 103/138]
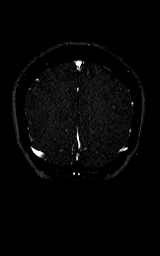
[im 105/138]
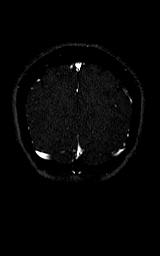
[im 108/138]
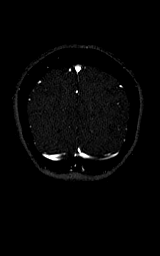
[im 111/138]
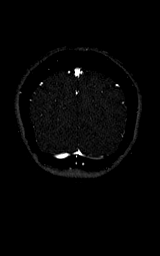
[im 114/138]
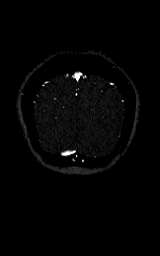
[im 117/138]
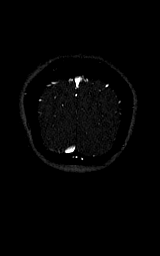
[im 120/138]
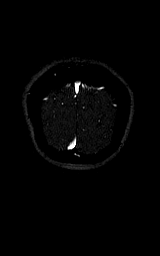
[im 123/138]
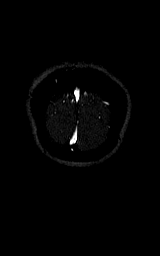
[im 126/138]
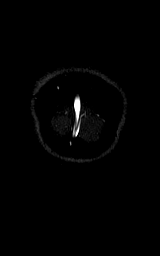
[im 129/138]
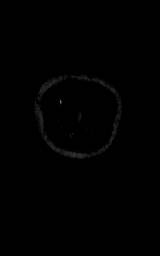
[im 132/138]
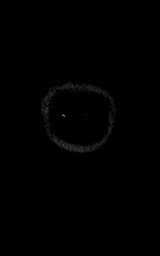
[im 135/138]
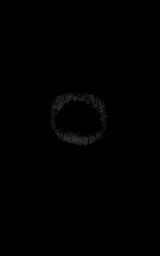
[im 138/138]
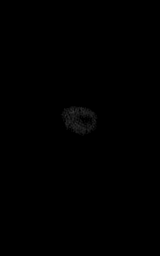

[48 of 48 positions shown; findings below may reference images not displayed]

FINDINGS: MRV source images demonstrate normal appearing cerebral volume, with
no ventriculomegaly, evidence of midline shift or intracranial mass
effect.

Preserved flow signal in the superior sagittal sinus, torcula,
straight sinus, vein of Abilio, internal cerebral veins, basal veins
of Reiz, inferior sagittal sinus, bilateral transverse and
sigmoid sinuses, and both IJ bulbs. The right transverse sinus
appears mildly dominant. There is also preserved flow signal in the
cavernous sinuses, and major draining cortical veins, including the
Trolar veins.
IMPRESSION: Normal intracranial MRV.

## 2021-02-13 ENCOUNTER — Other Ambulatory Visit: Payer: Self-pay

## 2021-02-17 NOTE — Telephone Encounter (Signed)
Patient is calling stating that she is really needing mediation refilled. She states she has been out since Saturday. Thanks

## 2021-02-18 MED ORDER — LEVOTHYROXINE SODIUM 125 MCG PO TABS: 125.0000 ug | ORAL_TABLET | Freq: Every day | ORAL | 0 refills | Status: DC

## 2021-03-11 ENCOUNTER — Encounter: Payer: Self-pay | Admitting: Family Medicine

## 2021-03-11 ENCOUNTER — Other Ambulatory Visit: Payer: Self-pay

## 2021-03-11 ENCOUNTER — Ambulatory Visit (INDEPENDENT_AMBULATORY_CARE_PROVIDER_SITE_OTHER): Payer: Managed Care, Other (non HMO) | Admitting: Family Medicine

## 2021-03-11 VITALS — BP 112/80 | HR 79 | Temp 98.7°F | Ht 62.0 in | Wt 149.0 lb

## 2021-03-11 DIAGNOSIS — Z3044 Encounter for surveillance of vaginal ring hormonal contraceptive device: Secondary | ICD-10-CM

## 2021-03-11 DIAGNOSIS — Z1211 Encounter for screening for malignant neoplasm of colon: Secondary | ICD-10-CM

## 2021-03-11 DIAGNOSIS — Z23 Encounter for immunization: Secondary | ICD-10-CM

## 2021-03-11 DIAGNOSIS — E039 Hypothyroidism, unspecified: Secondary | ICD-10-CM | POA: Diagnosis not present

## 2021-03-11 DIAGNOSIS — Z0000 Encounter for general adult medical examination without abnormal findings: Secondary | ICD-10-CM

## 2021-03-11 DIAGNOSIS — G8929 Other chronic pain: Secondary | ICD-10-CM

## 2021-03-11 DIAGNOSIS — R519 Headache, unspecified: Secondary | ICD-10-CM

## 2021-03-11 MED ORDER — AMITRIPTYLINE HCL 10 MG PO TABS: 10.0000 mg | ORAL_TABLET | Freq: Every day | ORAL | 3 refills | Status: DC

## 2021-03-11 MED ORDER — ETONOGESTREL-ETHINYL ESTRADIOL 0.12-0.015 MG/24HR VA RING: VAGINAL_RING | VAGINAL | 3 refills | Status: DC

## 2021-03-11 NOTE — Assessment & Plan Note (Signed)
As pt is 45 y/o and turns 45 in July, discussed USPSTF guidelines for colon cancer screening, and the three different options available.  Plan: Will refer pt to GI for colonoscopy to be scheduled in next few months Gave flu shot today in clinic

## 2021-03-11 NOTE — Progress Notes (Signed)
SUBJECTIVE:   CHIEF COMPLAINT / HPI: Follow Up for Headaches and Hypothyroidism  Paula Heath returns today for follow up of her headaches and hypothyroidism.  Headaches Paula Heath notes that Amitriptyline has been very helpful for her, and endorses having no headaches on days that she she takes it (though misses some doses every now and then). She denies having any auras. Paula Heath also denies any concern of depression, anxiety, or mood disturbance.  Hypothyroidism Paula Heath notes that she has continued to take Levothyroxine daily without missing any doses. She denies any fatigue or inordinate increased energy.   Birth Control The pt notes that she ran out of Nuvaring refills in December or January and did not call for a refill request, but does want a refill today. She has been sexually active with one female partner in the last year, and endorses inconsistent condom use. She has been having her normal, regular periods and her last was March 3. She denies any concern for STIs at this time.  PERTINENT  PMH / PSH:  Hypothyroidism Headaches Contraception Management  OBJECTIVE:   BP 112/80   Pulse 79   Temp 98.7 F (37.1 C)   Ht 5\' 2"  (1.575 m)   Wt 149 lb (67.6 kg)   LMP 02/13/2021 (Approximate)   SpO2 100%   BMI 27.25 kg/m   Physical Exam Constitutional:      General: She is not in acute distress.    Appearance: Normal appearance. She is not ill-appearing, toxic-appearing or diaphoretic.  HENT:     Head: Normocephalic and atraumatic.  Eyes:     General: No scleral icterus. Neck:     Thyroid: Thyromegaly present. No thyroid mass or thyroid tenderness.  Cardiovascular:     Rate and Rhythm: Normal rate and regular rhythm.     Pulses: Normal pulses.     Heart sounds: Normal heart sounds.  Pulmonary:     Effort: Pulmonary effort is normal. No respiratory distress.     Breath sounds: Normal breath sounds. No stridor. No wheezing, rhonchi or rales.  Chest:      Chest wall: No tenderness.  Musculoskeletal:     Cervical back: Neck supple.  Skin:    General: Skin is warm and dry.  Neurological:     Mental Status: She is alert and oriented to person, place, and time.  Psychiatric:        Mood and Affect: Mood normal.        Behavior: Behavior normal.        Thought Content: Thought content normal.        Judgment: Judgment normal.      ASSESSMENT/PLAN:   Hypothyroidism TSH today is pending. No symptoms of hypothyroidism, or complaints with current daily Levothyroxine dose.  Plan: Continue 04/15/2021 Levothyroxine  Headache Amitriptyline has been very effective for this pt. No auras or migraines. Also denies any depression or anxiety.  Plan: Continue 10mg  Amitriptyline  Contraception management Ran out of Nuvaring refills in December or January. Regular periods; LMP on 02/13/21. Sexually active with inconsistent condom use.   Plan: Restart Nuvaring when next period begins per current sexual activity and inconsistent condom use Advised requesting refill if pt runs out in the future Advised using condoms if not desiring to become pregnant while not using Nuvaring, and at least for one week after restarting Nuvaring   Healthcare maintenance As pt is 45 y/o and turns 45 in July, discussed USPSTF guidelines for colon cancer  screening, and the three different options available.  Plan: Will refer pt to GI for colonoscopy to be scheduled in next few months Gave flu shot today in clinic     Schuyler Beola Cord, Medical Student [facility]     Patient seen along with MS3 student Marcelline Mates. I personally evaluated this patient along with the student, and verified all aspects of the history, physical exam, and medical decision making as documented by the student. I agree with the student's documentation and have made all necessary edits.  Levert Feinstein, MD  [facility]

## 2021-03-11 NOTE — Assessment & Plan Note (Signed)
Amitriptyline has been very effective for this pt. No auras or migraines. Also denies any depression or anxiety.  Plan: Continue 10mg  Amitriptyline

## 2021-03-11 NOTE — Progress Notes (Signed)
error 

## 2021-03-11 NOTE — Assessment & Plan Note (Signed)
Ran out of Nuvaring refills in December or January. Regular periods; LMP on 02/13/21. Sexually active with inconsistent condom use.   Plan: Restart Nuvaring when next period begins per current sexual activity and inconsistent condom use Advised requesting refill if pt runs out in the future Advised using condoms if not desiring to become pregnant while not using Nuvaring, and at least for one week after restarting Nuvaring

## 2021-03-11 NOTE — Assessment & Plan Note (Signed)
TSH today is pending. No symptoms of hypothyroidism, or complaints with current daily Levothyroxine dose.  Plan: Continue Levothyroxine

## 2021-03-11 NOTE — Patient Instructions (Addendum)
Great to see you today! You can restart Nuvaring when your next period begins Continue using condoms at least 1 additional week after restarting Nuvaring Flu shot today We will refer you to GI to begin scheduling a colonoscopy We look forward to seeing you back in 6 months, please call our office or your pharmacy if you need any medication refills before then  Be well, Dr. [name]

## 2021-03-12 LAB — TSH: TSH: 0.083 u[IU]/mL — ABNORMAL LOW (ref 0.450–4.500)

## 2021-03-14 ENCOUNTER — Telehealth: Payer: Self-pay | Admitting: Family Medicine

## 2021-03-14 DIAGNOSIS — E039 Hypothyroidism, unspecified: Secondary | ICD-10-CM

## 2021-03-14 MED ORDER — LEVOTHYROXINE SODIUM 100 MCG PO TABS: 100.0000 ug | ORAL_TABLET | Freq: Every day | ORAL | 1 refills | Status: DC

## 2021-03-14 NOTE — Telephone Encounter (Signed)
Called patient about low TSH, need to decrease levothyroxine to daily and recheck in 1 month Patient agreeable & will schedule lab visit  [name], MD

## 2021-04-18 ENCOUNTER — Other Ambulatory Visit: Payer: Self-pay

## 2021-04-18 ENCOUNTER — Other Ambulatory Visit (INDEPENDENT_AMBULATORY_CARE_PROVIDER_SITE_OTHER): Payer: Managed Care, Other (non HMO)

## 2021-04-18 DIAGNOSIS — E039 Hypothyroidism, unspecified: Secondary | ICD-10-CM

## 2021-04-19 LAB — TSH: TSH: 2.63 u[IU]/mL (ref 0.450–4.500)

## 2021-04-22 ENCOUNTER — Other Ambulatory Visit: Payer: Self-pay | Admitting: Family Medicine

## 2021-04-22 DIAGNOSIS — Z1231 Encounter for screening mammogram for malignant neoplasm of breast: Secondary | ICD-10-CM

## 2021-05-06 ENCOUNTER — Other Ambulatory Visit: Payer: Self-pay | Admitting: Family Medicine

## 2021-06-30 ENCOUNTER — Encounter: Payer: Self-pay | Admitting: Gastroenterology

## 2021-07-04 ENCOUNTER — Other Ambulatory Visit: Payer: Self-pay

## 2021-07-04 ENCOUNTER — Ambulatory Visit
Admission: RE | Admit: 2021-07-04 | Discharge: 2021-07-04 | Disposition: A | Discharge status: Home / Self Care | Payer: Managed Care, Other (non HMO) | Source: Ambulatory Visit

## 2021-07-04 DIAGNOSIS — Z1231 Encounter for screening mammogram for malignant neoplasm of breast: Secondary | ICD-10-CM

## 2021-09-03 ENCOUNTER — Encounter: Payer: Self-pay | Admitting: Gastroenterology

## 2021-09-03 ENCOUNTER — Ambulatory Visit (AMBULATORY_SURGERY_CENTER): Payer: Managed Care, Other (non HMO) | Admitting: *Deleted

## 2021-09-03 ENCOUNTER — Other Ambulatory Visit: Payer: Self-pay

## 2021-09-03 VITALS — Ht 62.0 in | Wt 151.0 lb

## 2021-09-03 DIAGNOSIS — Z1211 Encounter for screening for malignant neoplasm of colon: Secondary | ICD-10-CM

## 2021-09-03 MED ORDER — PEG-KCL-NACL-NASULF-NA ASC-C 100 G PO SOLR: 1.0000 | Freq: Once | ORAL | 0 refills | Status: AC

## 2021-09-03 NOTE — Progress Notes (Signed)
Patient's pre-visit was done today over the phone with the patient due to COVID-19 pandemic. Name,DOB and address verified. Patient denies any allergies to Eggs and Soy. Patient denies any past surgeries. Patient is not taking any diet pills or blood thinners. No home Oxygen. Packet of Prep instructions mailed to patient including a copy of a consent form-pt is aware. Patient understands to call us back with any questions or concerns. Patient is aware of our care-partner policy and Covid-19 safety protocol.   EMMI education assigned to the patient for the procedure, sent to MyChart.   The patient is COVID-19 vaccinated. Patient has constipation-2day Movi/miralax prep used.

## 2021-09-17 ENCOUNTER — Encounter: Payer: Self-pay | Admitting: Gastroenterology

## 2021-09-17 ENCOUNTER — Ambulatory Visit (AMBULATORY_SURGERY_CENTER): Payer: Managed Care, Other (non HMO) | Admitting: Gastroenterology

## 2021-09-17 ENCOUNTER — Other Ambulatory Visit: Payer: Self-pay

## 2021-09-17 VITALS — BP 119/77 | HR 67 | Temp 96.8°F | Resp 17 | Ht 62.0 in | Wt 151.0 lb

## 2021-09-17 DIAGNOSIS — K648 Other hemorrhoids: Secondary | ICD-10-CM | POA: Diagnosis not present

## 2021-09-17 DIAGNOSIS — Z1211 Encounter for screening for malignant neoplasm of colon: Secondary | ICD-10-CM

## 2021-09-17 DIAGNOSIS — K5900 Constipation, unspecified: Secondary | ICD-10-CM | POA: Diagnosis present

## 2021-09-17 MED ORDER — SODIUM CHLORIDE 0.9 % IV SOLN: 500.0000 mL | Freq: Once | INTRAVENOUS | Status: DC

## 2021-09-17 NOTE — Progress Notes (Signed)
A and O x3. Report to RN. Tolerated MAC anesthesia well. 

## 2021-09-17 NOTE — Progress Notes (Addendum)
   Referring Provider: [name], MD Primary Care Physician:  [name], MD  Reason for Procedure:  Colon cancer screening   IMPRESSION:  Need for colon cancer screening Appropriate candidate for monitored anesthesia care  PLAN: Colonoscopy in the [facility] today   HPI: Paula Heath is a 45 y.o. female presents for screening colonoscopy.  No prior colonoscopy or colon cancer screening.  No baseline GI symptoms except for chronic constipation that she currently treats with probiotics.   Family history is unknown.    Past Medical History:  Diagnosis Date   Allergy    seasonal   Anemia    1998   Asthma    as a child   Depression 07/01/2012   Hypothyroidism 08/26/2012   Thyroid disease    Ulcer    with bleeding around age 53   Urticaria     Past Surgical History:  Procedure Laterality Date   NO PAST SURGERIES      Current Outpatient Medications  Medication Sig Dispense Refill   cetirizine (ZYRTEC) 10 MG tablet Take 10 mg by mouth daily.     etonogestrel-ethinyl estradiol (ELURYNG) 0.12-0.015 MG/24HR vaginal ring INSERT VAGINALLY AND LEAVE IN PLACE FOR 3 CONSECUTIVE WEEKS,THEN REMOVE FOR 1 WEEK 4 each 3   EUTHYROX 100 MCG tablet Take 1 tablet by mouth once daily 90 tablet 3   Probiotic Product (PROBIOTIC DAILY PO) Take by mouth.     No current facility-administered medications for this visit.    Allergies as of 09/17/2021 - Review Complete 09/03/2021  Allergen Reaction Noted   Benzyl alcohol Swelling 05/11/2012   Nickel Swelling 05/11/2012    Family History  Problem Relation Age of Onset   Diabetes Maternal Grandmother    Thyroid disease Other        Aunt deceased of some form of thyroid disease in 2014   Colon cancer Neg Hx      Physical Exam: General:   Alert,  well-nourished, pleasant and cooperative in NAD Head:  Normocephalic and atraumatic. Eyes:  Sclera clear, no icterus.   Conjunctiva pink. Mouth:  No deformity or  lesions.   Neck:  Supple; no masses or thyromegaly. Lungs:  Clear throughout to auscultation.   No wheezes. Heart:  Regular rate and rhythm; no murmurs. Abdomen:  Soft, non-tender, nondistended, normal bowel sounds, no rebound or guarding.  Msk:  Symmetrical. No boney deformities LAD: No inguinal or umbilical LAD Extremities:  No clubbing or edema. Neurologic:  Alert and  oriented x4;  grossly nonfocal Skin:  No obvious rash or bruise. Psych:  Alert and cooperative. Normal mood and affect.      Diondra Pines L. Orvan Falconer, MD, MPH 09/17/2021, 8:20 AM

## 2021-09-17 NOTE — Patient Instructions (Signed)
Discharge instructions given. °Handout on Hemorrhoids. °Resume previous medications. °YOU HAD AN ENDOSCOPIC PROCEDURE TODAY AT THE [facility]:   Refer to the procedure report that was given to you for any specific questions about what was found during the examination.  If the procedure report does not answer your questions, please call your gastroenterologist to clarify.  If you requested that your care partner not be given the details of your procedure findings, then the procedure report has been included in a sealed envelope for you to review at your convenience later. ° °YOU SHOULD EXPECT: Some feelings of bloating in the abdomen. Passage of more gas than usual.  Walking can help get rid of the air that was put into your GI tract during the procedure and reduce the bloating. If you had a lower endoscopy (such as a colonoscopy or flexible sigmoidoscopy) you may notice spotting of blood in your stool or on the toilet paper. If you underwent a bowel prep for your procedure, you may not have a normal bowel movement for a few days. ° °Please Note:  You might notice some irritation and congestion in your nose or some drainage.  This is from the oxygen used during your procedure.  There is no need for concern and it should clear up in a day or so. ° °SYMPTOMS TO REPORT IMMEDIATELY: ° °Following lower endoscopy (colonoscopy or flexible sigmoidoscopy): ° Excessive amounts of blood in the stool ° Significant tenderness or worsening of abdominal pains ° Swelling of the abdomen that is new, acute ° Fever of 100°F or higher ° ° °For urgent or emergent issues, a gastroenterologist can be reached at any hour by calling [phone]. °Do not use MyChart messaging for urgent concerns.  ° ° °DIET:  We do recommend a small meal at first, but then you may proceed to your regular diet.  Drink plenty of fluids but you should avoid alcoholic beverages for 24 hours. ° °ACTIVITY:  You should plan to take it easy for the  rest of today and you should NOT DRIVE or use heavy machinery until tomorrow (because of the sedation medicines used during the test).   ° °FOLLOW UP: °Our staff will call the number listed on your records 48-72 hours following your procedure to check on you and address any questions or concerns that you may have regarding the information given to you following your procedure. If we do not reach you, we will leave a message.  We will attempt to reach you two times.  During this call, we will ask if you have developed any symptoms of COVID 19. If you develop any symptoms (ie: fever, flu-like symptoms, shortness of breath, cough etc.) before then, please call [phone].  If you test positive for Covid 19 in the 2 weeks post procedure, please call and report this information to us.   ° °If any biopsies were taken you will be contacted by phone or by letter within the next 1-3 weeks.  Please call us at [phone] if you have not heard about the biopsies in 3 weeks.  ° ° °SIGNATURES/CONFIDENTIALITY: °You and/or your care partner have signed paperwork which will be entered into your electronic medical record.  These signatures attest to the fact that that the information above on your After Visit Summary has been reviewed and is understood.  Full responsibility of the confidentiality of this discharge information lies with you and/or your care-partner.  °

## 2021-09-17 NOTE — Op Note (Signed)
[facility] Patient Name: Saranya Harlin Procedure Date: 09/17/2021 8:44 AM MRN: [id number] Endoscopist: Tressia Danas MD, MD Age: 45 Referring MD:  Date of Birth: [birth year] Gender: Female Account #: [id number] Procedure:                Colonoscopy Indications:              Screening for colorectal malignant neoplasm, This                            is the patient's first colonoscopy, Incidental                            constipation noted Medicines:                Monitored Anesthesia Care Procedure:                Pre-Anesthesia Assessment:                           - Prior to the procedure, a History and Physical                            was performed, and patient medications and                            allergies were reviewed. The patient's tolerance of                            previous anesthesia was also reviewed. The risks                            and benefits of the procedure and the sedation                            options and risks were discussed with the patient.                            All questions were answered, and informed consent                            was obtained. Prior Anticoagulants: The patient has                            taken no previous anticoagulant or antiplatelet                            agents. ASA Grade Assessment: II - A patient with                            mild systemic disease. After reviewing the risks                            and benefits, the patient was deemed in  satisfactory condition to undergo the procedure.                           After obtaining informed consent, the colonoscope                            was passed under direct vision. Throughout the                            procedure, the patient's blood pressure, pulse, and                            oxygen saturations were monitored continuously. The                            Olympus PCF-H190DL ([id number])  Colonoscope was                            introduced through the anus and advanced to the 3                            cm into the ileum. A second forward view of the                            right colon was performed. The colonoscopy was                            performed without difficulty. The patient tolerated                            the procedure well. The quality of the bowel                            preparation was excellent. The terminal ileum,                            ileocecal valve, appendiceal orifice, and rectum                            were photographed. Scope In: 8:51:57 AM Scope Out: 9:08:17 AM Scope Withdrawal Time: 0 hours 12 minutes 14 seconds  Total Procedure Duration: 0 hours 16 minutes 20 seconds  Findings:                 The perianal and digital rectal examinations were                            normal.                           Non-bleeding internal hemorrhoids were found. The                            hemorrhoids were small.  The exam was otherwise without abnormality on                            direct and retroflexion views. Complications:            No immediate complications. Estimated Blood Loss:     Estimated blood loss: none. Impression:               - Non-bleeding internal hemorrhoids.                           - The examination was otherwise normal on direct                            and retroflexion views. Recommendation:           - Patient has a contact number available for                            emergencies. The signs and symptoms of potential                            delayed complications were discussed with the                            patient. Return to normal activities tomorrow.                            Written discharge instructions were provided to the                            patient.                           - Resume previous diet.                           - Continue present  medications.                           - Repeat colonoscopy in 10 years for surveillance,                            earlier with new symptoms.                           - Emerging evidence supports eating a diet of                            fruits, vegetables, grains, calcium, and yogurt                            while reducing red meat and alcohol may reduce the                            risk of colon cancer.                           -  Thank you for allowing me to be involved in your                            colon cancer prevention. Tressia Danas MD, MD 09/17/2021 9:12:46 AM This report has been signed electronically.

## 2021-09-17 NOTE — Progress Notes (Signed)
Pt's states no medical or surgical changes since previsit or office visit.   CHECK-IN-AER  V/S-SB

## 2021-09-19 ENCOUNTER — Telehealth: Payer: Self-pay | Admitting: *Deleted

## 2021-09-19 ENCOUNTER — Telehealth: Payer: Self-pay

## 2021-09-19 NOTE — Telephone Encounter (Signed)
Attempted to call patient for their post-procedure follow-up call. No answer. Left voicemail.   

## 2021-09-19 NOTE — Telephone Encounter (Signed)
Left message on answering machine. 

## 2021-12-03 ENCOUNTER — Ambulatory Visit: Payer: Managed Care, Other (non HMO) | Admitting: Family Medicine

## 2021-12-03 ENCOUNTER — Other Ambulatory Visit: Payer: Self-pay

## 2021-12-03 ENCOUNTER — Ambulatory Visit (INDEPENDENT_AMBULATORY_CARE_PROVIDER_SITE_OTHER): Payer: Managed Care, Other (non HMO)

## 2021-12-03 VITALS — BP 109/80 | HR 88 | Ht 62.0 in

## 2021-12-03 DIAGNOSIS — Z23 Encounter for immunization: Secondary | ICD-10-CM | POA: Diagnosis not present

## 2021-12-03 DIAGNOSIS — G479 Sleep disorder, unspecified: Secondary | ICD-10-CM | POA: Diagnosis not present

## 2021-12-03 DIAGNOSIS — S0502XA Injury of conjunctiva and corneal abrasion without foreign body, left eye, initial encounter: Secondary | ICD-10-CM

## 2021-12-03 MED ORDER — ERYTHROMYCIN 5 MG/GM OP OINT: 1.0000 "application " | TOPICAL_OINTMENT | Freq: Four times a day (QID) | OPHTHALMIC | 0 refills | Status: DC

## 2021-12-03 MED ORDER — HYDROXYZINE HCL 25 MG PO TABS: 25.0000 mg | ORAL_TABLET | Freq: Every evening | ORAL | 0 refills | Status: DC | PRN

## 2021-12-03 NOTE — Patient Instructions (Signed)
Stop by the pharmacy to pick up your medications.  Take hydroxyzine at bedtime to help with sleep.   Apply a 1 inch ribbon of ointment to your eye four times a day for the next five days.   Take Care,  Dr Rachael Darby

## 2021-12-03 NOTE — Progress Notes (Addendum)
° °  SUBJECTIVE:   CHIEF COMPLAINT / HPI:   Chief Complaint  Patient presents with   Eye Pain    Pt stated that she has been having some eye pain for 2 days now   Insomnia     Paula Heath is a 45 y.o. female here for left eye pain since waking up yesterday.  Noted to have a throbbing pain and tearing.  She sleeps in her contacts only on the weekends.  Denies eye trauma or blurry vision.  She notes difficulty sleeping at night as her mind races.  Reports increased life stress.  She has tried melatonin without relief.    PERTINENT  PMH / PSH: reviewed and updated as appropriate   OBJECTIVE:   BP 109/80    Pulse 88    Ht 5\' 2"  (1.575 m)    LMP 11/23/2021    SpO2 100%    BMI 27.62 kg/m     Physical Exam Constitutional:      General: She is not in acute distress.    Appearance: Normal appearance. She is not ill-appearing.  Eyes:     General: Lids are normal. Lids are everted, no foreign bodies appreciated. Vision grossly intact.        Right eye: No foreign body, discharge or hordeolum.        Left eye: No foreign body, discharge or hordeolum.     Extraocular Movements: Extraocular movements intact.     Conjunctiva/sclera:     Right eye: Right conjunctiva is not injected.     Left eye: Left conjunctiva is injected. No chemosis, exudate or hemorrhage.    Pupils: Pupils are equal, round, and reactive to light.     Left eye: Fluorescein uptake present.     Comments: Left lower medial and lateral abrasions seen on fluorescein stain  Cardiovascular:     Rate and Rhythm: Normal rate.  Pulmonary:     Effort: Pulmonary effort is normal.  Neurological:     Mental Status: She is alert.     ASSESSMENT/PLAN:   Corneal abrasion Patient is a 45 year old who wears glasses and contacts.  Fluorescein stain concerning for left eye corneal abrasions. No pain with EOM. Doubt preseptal or orbital cellulitis. Start erythromycin ointment x5 days.  Advised to follow-up if not improving or  worsening with ophthalmologist.  Patient voiced understanding.  Difficulty sleeping Reviewed sleep hygiene.  Trial Atarax this provides antianxiety properties as well.    54, DO PGY-3, [facility] 12/03/2021

## 2021-12-04 ENCOUNTER — Encounter: Payer: Self-pay | Admitting: Family Medicine

## 2022-01-01 ENCOUNTER — Other Ambulatory Visit: Payer: Self-pay | Admitting: Family Medicine

## 2022-03-02 ENCOUNTER — Other Ambulatory Visit: Payer: Self-pay | Admitting: Family Medicine

## 2022-03-23 ENCOUNTER — Other Ambulatory Visit: Payer: Self-pay | Admitting: Family Medicine

## 2022-03-23 DIAGNOSIS — Z3044 Encounter for surveillance of vaginal ring hormonal contraceptive device: Secondary | ICD-10-CM

## 2022-04-30 ENCOUNTER — Encounter: Payer: Self-pay | Admitting: Family Medicine

## 2022-04-30 ENCOUNTER — Ambulatory Visit (INDEPENDENT_AMBULATORY_CARE_PROVIDER_SITE_OTHER): Payer: Managed Care, Other (non HMO) | Admitting: Family Medicine

## 2022-04-30 VITALS — BP 100/75 | HR 75 | Ht 62.0 in | Wt 153.8 lb

## 2022-04-30 DIAGNOSIS — Z3044 Encounter for surveillance of vaginal ring hormonal contraceptive device: Secondary | ICD-10-CM | POA: Diagnosis not present

## 2022-04-30 DIAGNOSIS — Z13220 Encounter for screening for lipoid disorders: Secondary | ICD-10-CM

## 2022-04-30 DIAGNOSIS — Z0000 Encounter for general adult medical examination without abnormal findings: Secondary | ICD-10-CM | POA: Diagnosis not present

## 2022-04-30 DIAGNOSIS — E039 Hypothyroidism, unspecified: Secondary | ICD-10-CM

## 2022-04-30 MED ORDER — SENNA 8.6 MG PO TABS: 2.0000 | ORAL_TABLET | Freq: Every day | ORAL | 1 refills | Status: DC

## 2022-04-30 MED ORDER — ETONOGESTREL-ETHINYL ESTRADIOL 0.12-0.015 MG/24HR VA RING: VAGINAL_RING | VAGINAL | 11 refills | Status: DC

## 2022-04-30 MED ORDER — POLYETHYLENE GLYCOL 3350 17 GM/SCOOP PO POWD: 17.0000 g | Freq: Two times a day (BID) | ORAL | 1 refills | Status: DC

## 2022-04-30 NOTE — Patient Instructions (Addendum)
It was great to see you again today!  Checking thyroid, kidneys, cholesterol today.  Call the [facility] to set up an appointment for weight management. Their number is [phone].   Start senna 2 pills a day, and go up to twice daily on the miralax. Follow up if constipation not better in 3-4 weeks  Refilled birth control  Be well, Dr. [name]  Health Maintenance, Female Adopting a healthy lifestyle and getting preventive care are important in promoting health and wellness. Ask your health care provider about: The right schedule for you to have regular tests and exams. Things you can do on your own to prevent diseases and keep yourself healthy. What should I know about diet, weight, and exercise? Eat a healthy diet  Eat a diet that includes plenty of vegetables, fruits, low-fat dairy products, and lean protein. Do not eat a lot of foods that are high in solid fats, added sugars, or sodium. Maintain a healthy weight Body mass index (BMI) is used to identify weight problems. It estimates body fat based on height and weight. Your health care provider can help determine your BMI and help you achieve or maintain a healthy weight. Get regular exercise Get regular exercise. This is one of the most important things you can do for your health. Most adults should: Exercise for at least 150 minutes each week. The exercise should increase your heart rate and make you sweat (moderate-intensity exercise). Do strengthening exercises at least twice a week. This is in addition to the moderate-intensity exercise. Spend less time sitting. Even light physical activity can be beneficial. Watch cholesterol and blood lipids Have your blood tested for lipids and cholesterol at 46 years of age, then have this test every 5 years. Have your cholesterol levels checked more often if: Your lipid or cholesterol levels are high. You are older than 46 years of age. You are at  high risk for heart disease. What should I know about cancer screening? Depending on your health history and family history, you may need to have cancer screening at various ages. This may include screening for: Breast cancer. Cervical cancer. Colorectal cancer. Skin cancer. Lung cancer. What should I know about heart disease, diabetes, and high blood pressure? Blood pressure and heart disease High blood pressure causes heart disease and increases the risk of stroke. This is more likely to develop in people who have high blood pressure readings or are overweight. Have your blood pressure checked: Every 3-5 years if you are 70-29 years of age. Every year if you are 2 years old or older. Diabetes Have regular diabetes screenings. This checks your fasting blood sugar level. Have the screening done: Once every three years after age 10 if you are at a normal weight and have a low risk for diabetes. More often and at a younger age if you are overweight or have a high risk for diabetes. What should I know about preventing infection? Hepatitis B If you have a higher risk for hepatitis B, you should be screened for this virus. Talk with your health care provider to find out if you are at risk for hepatitis B infection. Hepatitis C Testing is recommended for: Everyone born from [birth year] through 1965. Anyone with known risk factors for hepatitis C. Sexually transmitted infections (STIs) Get screened for STIs, including gonorrhea and chlamydia, if: You are sexually active and are younger than 46 years of age. You are older than 46 years of age and your  health care provider tells you that you are at risk for this type of infection. Your sexual activity has changed since you were last screened, and you are at increased risk for chlamydia or gonorrhea. Ask your health care provider if you are at risk. Ask your health care provider about whether you are at high risk for HIV. Your health care provider may  recommend a prescription medicine to help prevent HIV infection. If you choose to take medicine to prevent HIV, you should first get tested for HIV. You should then be tested every 3 months for as long as you are taking the medicine. Pregnancy If you are about to stop having your period (premenopausal) and you may become pregnant, seek counseling before you get pregnant. Take 400 to 800 micrograms (mcg) of folic acid every day if you become pregnant. Ask for birth control (contraception) if you want to prevent pregnancy. Osteoporosis and menopause Osteoporosis is a disease in which the bones lose minerals and strength with aging. This can result in bone fractures. If you are 69 years old or older, or if you are at risk for osteoporosis and fractures, ask your health care provider if you should: Be screened for bone loss. Take a calcium or vitamin D supplement to lower your risk of fractures. Be given hormone replacement therapy (HRT) to treat symptoms of menopause. Follow these instructions at home: Alcohol use Do not drink alcohol if: Your health care provider tells you not to drink. You are pregnant, may be pregnant, or are planning to become pregnant. If you drink alcohol: Limit how much you have to: 0-1 drink a day. Know how much alcohol is in your drink. In the [place] one drink equals one 12 oz bottle of beer (355 mL), one 5 oz glass of wine (148 mL), or one 1 oz glass of hard liquor (44 mL). Lifestyle Do not use any products that contain nicotine or tobacco. These products include cigarettes, chewing tobacco, and vaping devices, such as e-cigarettes. If you need help quitting, ask your health care provider. Do not use street drugs. Do not share needles. Ask your health care provider for help if you need support or information about quitting drugs. General instructions Schedule regular health, dental, and eye exams. Stay current with your vaccines. Tell your health care provider  if: You often feel depressed. You have ever been abused or do not feel safe at home. Summary Adopting a healthy lifestyle and getting preventive care are important in promoting health and wellness. Follow your health care provider's instructions about healthy diet, exercising, and getting tested or screened for diseases. Follow your health care provider's instructions on monitoring your cholesterol and blood pressure. This information is not intended to replace advice given to you by your health care provider. Make sure you discuss any questions you have with your health care provider. Document Revised: 04/21/2021 Document Reviewed: 04/21/2021 Elsevier Patient Education  2023 [employer].

## 2022-04-30 NOTE — Progress Notes (Signed)
  Date of Visit: 04/30/2022   SUBJECTIVE:   HPI:  Paula Heath presents today for a well woman exam.   Concerns today: needs refills Contraception: nuvaring, likes this form of birth control STD Screening: declines today Pap smear status: UTD Smoking: no Mood: overall well, no concerns  Hypothyroidism - taking levothyroxine 143mcg daily. Tolerating well. Has noticed difficulty with losing weight. Tries to eat healthy and stay active but hasn't had the results she wants.  Constipation - has been doing miralax 17mg  daily without much relief. Stools not more often than about every 7 days. No blood in stool. Has had prior colonoscopy which was normal.  Migraines - not on amitryptline anymore and doing well off of it.   OBJECTIVE:   BP 100/75   Pulse 75   Ht 5\' 2"  (1.575 m)   Wt 153 lb 12.8 oz (69.8 kg)   LMP 04/29/2022   SpO2 100%   BMI 28.13 kg/m  Gen: NAD, pleasant, cooperative HEENT: NCAT, PERRL, no anterior cervical lymphadenopathy Heart: RRR, no murmurs Lungs: CTAB, NWOB Abdomen: soft, nontender to palpation Neuro: grossly nonfocal, speech normal  ASSESSMENT/PLAN:   Health maintenance:  -pap smear: UTD -mammogram: UTD -lipid screening: check lipids & bmet today -colon cancer screening: UTD -immunizations:  Flu: not flu season Tdap: UTD COVID: UTD -handout given on health maintenance topics  Hypothyroidism Update TSH today, titrate levothyroxine based on results  Overweight Given contact info for [facility], patient motivated and will contact them for an appointment  Constipation Uncontrolled. Increase miralax to twice daily and add senna. Call if not improving on this regimen and follow up in clinic.  Migraines Doing well off prophylactic medication.  [place]. Ardelia Mems, [place]

## 2022-05-01 LAB — BASIC METABOLIC PANEL
BUN/Creatinine Ratio: 18 (ref 9–23)
BUN: 15 mg/dL (ref 6–24)
CO2: 23 mmol/L (ref 20–29)
Calcium: 9.4 mg/dL (ref 8.7–10.2)
Chloride: 102 mmol/L (ref 96–106)
Creatinine, Ser: 0.83 mg/dL (ref 0.57–1.00)
Glucose: 79 mg/dL (ref 70–99)
Potassium: 4.4 mmol/L (ref 3.5–5.2)
Sodium: 138 mmol/L (ref 134–144)
eGFR: 89 mL/min/{1.73_m2} (ref >59)

## 2022-05-01 LAB — LIPID PANEL
Chol/HDL Ratio: 3 ratio (ref 0.0–4.4)
Cholesterol, Total: 219 mg/dL — ABNORMAL HIGH (ref 100–199)
HDL: 73 mg/dL (ref >39)
LDL Chol Calc (NIH): 137 mg/dL — ABNORMAL HIGH (ref 0–99)
Triglycerides: 51 mg/dL (ref 0–149)
VLDL Cholesterol Cal: 9 mg/dL (ref 5–40)

## 2022-05-01 LAB — TSH: TSH: 0.653 u[IU]/mL (ref 0.450–4.500)

## 2022-05-01 MED ORDER — LEVOTHYROXINE SODIUM 100 MCG PO TABS: 100.0000 ug | ORAL_TABLET | Freq: Every day | ORAL | 3 refills | Status: DC

## 2022-05-19 ENCOUNTER — Encounter: Payer: Self-pay | Admitting: *Deleted

## 2022-08-04 ENCOUNTER — Other Ambulatory Visit: Payer: Self-pay | Admitting: Family Medicine

## 2022-08-04 DIAGNOSIS — Z1231 Encounter for screening mammogram for malignant neoplasm of breast: Secondary | ICD-10-CM

## 2022-08-26 DIAGNOSIS — Z1231 Encounter for screening mammogram for malignant neoplasm of breast: Secondary | ICD-10-CM

## 2022-09-18 ENCOUNTER — Other Ambulatory Visit: Payer: Self-pay | Admitting: Family Medicine

## 2022-09-18 DIAGNOSIS — Z1231 Encounter for screening mammogram for malignant neoplasm of breast: Secondary | ICD-10-CM

## 2022-10-21 DIAGNOSIS — Z1231 Encounter for screening mammogram for malignant neoplasm of breast: Secondary | ICD-10-CM

## 2023-04-21 ENCOUNTER — Ambulatory Visit: Payer: Managed Care, Other (non HMO) | Admitting: Family Medicine

## 2023-04-21 ENCOUNTER — Other Ambulatory Visit (HOSPITAL_COMMUNITY)
Admission: RE | Admit: 2023-04-21 | Discharge: 2023-04-21 | Disposition: A | Discharge status: Home / Self Care | Payer: Managed Care, Other (non HMO) | Source: Ambulatory Visit | Attending: Family Medicine | Admitting: Family Medicine

## 2023-04-21 ENCOUNTER — Encounter: Payer: Self-pay | Admitting: Family Medicine

## 2023-04-21 VITALS — BP 143/80 | HR 75 | Wt 162.6 lb

## 2023-04-21 DIAGNOSIS — R5383 Other fatigue: Secondary | ICD-10-CM | POA: Diagnosis not present

## 2023-04-21 DIAGNOSIS — Z113 Encounter for screening for infections with a predominantly sexual mode of transmission: Secondary | ICD-10-CM | POA: Insufficient documentation

## 2023-04-21 DIAGNOSIS — Z3044 Encounter for surveillance of vaginal ring hormonal contraceptive device: Secondary | ICD-10-CM

## 2023-04-21 DIAGNOSIS — Z0000 Encounter for general adult medical examination without abnormal findings: Secondary | ICD-10-CM | POA: Diagnosis not present

## 2023-04-21 DIAGNOSIS — Z124 Encounter for screening for malignant neoplasm of cervix: Secondary | ICD-10-CM | POA: Diagnosis present

## 2023-04-21 DIAGNOSIS — N841 Polyp of cervix uteri: Secondary | ICD-10-CM

## 2023-04-21 DIAGNOSIS — E039 Hypothyroidism, unspecified: Secondary | ICD-10-CM | POA: Diagnosis not present

## 2023-04-21 MED ORDER — ETONOGESTREL-ETHINYL ESTRADIOL 0.12-0.015 MG/24HR VA RING: VAGINAL_RING | VAGINAL | 4 refills | Status: DC

## 2023-04-21 NOTE — Progress Notes (Addendum)
  Date of Visit: 04/21/2023   SUBJECTIVE:   HPI:  Paula Heath presents today for a well woman exam.   Concerns today: ankle swelling, leaking urine, clumsiness, fatigue, see below Periods: monthly periods, no concerns Contraception: nuvaring, happy with this form of contraception Pelvic symptoms: no vaginal discharge or pelvic pain Sexual activity: one female partner in last year STD Screening: desires today Pap smear status: due today Exercise: works out regularly at gym Smoking: no Alcohol: occasional Drugs: no Mood: no concerns, overall doing well Dentist: yes, goes regularly Cancers in family: none she knows of - family died when she was young  Tired, not sleeping good at night.  Fatigue during the day.  No shortness of breath.  Does have some ankle swelling she has noticed.  Leaking urine, both while sitting still and with stress activities.  Answered yes on fall screen, reports she has stumbled or fallen in the last year, daughter has noticed she is somewhat clumsy.   OBJECTIVE:   BP (!) 143/80   Pulse 75   Wt 162 lb 9.6 oz (73.8 kg)   LMP 03/28/2023   SpO2 100%   BMI 29.74 kg/m  Gen: NAD, pleasant, cooperative HEENT: NCAT, PERRL, no palpable thyromegaly or anterior cervical lymphadenopathy Heart: RRR, no murmurs Lungs: CTAB, NWOB Abdomen: soft, nontender to palpation Neuro: grossly nonfocal, speech normal GU: normal appearing external genitalia without lesions. Vagina is moist with white discharge. Cervix normal in appearance, though does have approximate 1 cm cervical polyp present at os. No cervical motion tenderness or tenderness on bimanual exam. No adnexal masses. Chaperone present - Jake Seats CMA  ASSESSMENT/PLAN:   Health maintenance:  -STD screening: obtained gc/chl/trich today along with HIV and RPR -pap smear: cotest obtained today -mammogram: reviewed risks/benefits of starting mammos now -lipid screening: ok lipids last year, not fasting today  so will defer for another year -colon cancer screening: UTD -handout given on health maintenance topics  Hypothyroidism Update TSH today, titrate Synthroid based on results  Fatigue Endorses recurrent fatigue.  No obvious findings on exam.  Check labs today: TSH, CBC, CMET.  At risk for sleep apnea with elevated BMI, agreeable to sleep study.  Split-night study ordered.  Contraception management Doing well on NuvaRing, refilled this for her.  Ankle swelling No shortness of breath or other signs of CHF.  Checking CMET, TSH, CBC as above.  Cervical polyp Noted on pelvic exam.  Endorses bleeding after intercourse.  Reviewed option of removal, patient does desire removal.  Scheduled her in colpo clinic to have it removed.  Asked her to follow-up separately for clumsiness and urine leakage, as we did not have time to address those issues well today during her physical.  FOLLOW UP: Follow up at patient's convenience for clumsiness and urine leakage Follow-up next week in colpo clinic for polyp removal Next physical in 1 year  [place] J. [name], MD [facility]

## 2023-04-21 NOTE — Assessment & Plan Note (Signed)
Endorses recurrent fatigue.  No obvious findings on exam.  Check labs today: TSH, CBC, CMET.  At risk for sleep apnea with elevated BMI, agreeable to sleep study.  Split-night study ordered.

## 2023-04-21 NOTE — Assessment & Plan Note (Signed)
Update TSH today, titrate Synthroid based on results

## 2023-04-21 NOTE — Assessment & Plan Note (Signed)
Doing well on NuvaRing, refilled this for her.

## 2023-04-21 NOTE — Patient Instructions (Addendum)
It was great to see you again today.  Checking labs today: pap, kidneys, blood sugar, electrolytes, blood counts, STIs, liver, thyroid  Ordered sleep study for you  Return for another visit to discuss leaking urine and clumsiness - we weill discuss in more detail then  Refilled birth control, will send in thyroid medication once labs have returned  Be well, Dr. [name]  Health Maintenance, Female Adopting a healthy lifestyle and getting preventive care are important in promoting health and wellness. Ask your health care provider about: The right schedule for you to have regular tests and exams. Things you can do on your own to prevent diseases and keep yourself healthy. What should I know about diet, weight, and exercise? Eat a healthy diet  Eat a diet that includes plenty of vegetables, fruits, low-fat dairy products, and lean protein. Do not eat a lot of foods that are high in solid fats, added sugars, or sodium. Maintain a healthy weight Body mass index (BMI) is used to identify weight problems. It estimates body fat based on height and weight. Your health care provider can help determine your BMI and help you achieve or maintain a healthy weight. Get regular exercise Get regular exercise. This is one of the most important things you can do for your health. Most adults should: Exercise for at least 150 minutes each week. The exercise should increase your heart rate and make you sweat (moderate-intensity exercise). Do strengthening exercises at least twice a week. This is in addition to the moderate-intensity exercise. Spend less time sitting. Even light physical activity can be beneficial. Watch cholesterol and blood lipids Have your blood tested for lipids and cholesterol at 47 years of age, then have this test every 5 years. Have your cholesterol levels checked more often if: Your lipid or cholesterol levels are high. You are older than 47 years of age. You are at high risk  for heart disease. What should I know about cancer screening? Depending on your health history and family history, you may need to have cancer screening at various ages. This may include screening for: Breast cancer. Cervical cancer. Colorectal cancer. Skin cancer. Lung cancer. What should I know about heart disease, diabetes, and high blood pressure? Blood pressure and heart disease High blood pressure causes heart disease and increases the risk of stroke. This is more likely to develop in people who have high blood pressure readings or are overweight. Have your blood pressure checked: Every 3-5 years if you are 35-37 years of age. Every year if you are 88 years old or older. Diabetes Have regular diabetes screenings. This checks your fasting blood sugar level. Have the screening done: Once every three years after age 11 if you are at a normal weight and have a low risk for diabetes. More often and at a younger age if you are overweight or have a high risk for diabetes. What should I know about preventing infection? Hepatitis B If you have a higher risk for hepatitis B, you should be screened for this virus. Talk with your health care provider to find out if you are at risk for hepatitis B infection. Hepatitis C Testing is recommended for: Everyone born from [birth year] through 1965. Anyone with known risk factors for hepatitis C. Sexually transmitted infections (STIs) Get screened for STIs, including gonorrhea and chlamydia, if: You are sexually active and are younger than 47 years of age. You are older than 47 years of age and your health care provider tells you that  you are at risk for this type of infection. Your sexual activity has changed since you were last screened, and you are at increased risk for chlamydia or gonorrhea. Ask your health care provider if you are at risk. Ask your health care provider about whether you are at high risk for HIV. Your health care provider may recommend  a prescription medicine to help prevent HIV infection. If you choose to take medicine to prevent HIV, you should first get tested for HIV. You should then be tested every 3 months for as long as you are taking the medicine. Pregnancy If you are about to stop having your period (premenopausal) and you may become pregnant, seek counseling before you get pregnant. Take 400 to 800 micrograms (mcg) of folic acid every day if you become pregnant. Ask for birth control (contraception) if you want to prevent pregnancy. Osteoporosis and menopause Osteoporosis is a disease in which the bones lose minerals and strength with aging. This can result in bone fractures. If you are 106 years old or older, or if you are at risk for osteoporosis and fractures, ask your health care provider if you should: Be screened for bone loss. Take a calcium or vitamin D supplement to lower your risk of fractures. Be given hormone replacement therapy (HRT) to treat symptoms of menopause. Follow these instructions at home: Alcohol use Do not drink alcohol if: Your health care provider tells you not to drink. You are pregnant, may be pregnant, or are planning to become pregnant. If you drink alcohol: Limit how much you have to: 0-1 drink a day. Know how much alcohol is in your drink. In the [place] one drink equals one 12 oz bottle of beer (355 mL), one 5 oz glass of wine (148 mL), or one 1 oz glass of hard liquor (44 mL). Lifestyle Do not use any products that contain nicotine or tobacco. These products include cigarettes, chewing tobacco, and vaping devices, such as e-cigarettes. If you need help quitting, ask your health care provider. Do not use street drugs. Do not share needles. Ask your health care provider for help if you need support or information about quitting drugs. General instructions Schedule regular health, dental, and eye exams. Stay current with your vaccines. Tell your health care provider if: You often  feel depressed. You have ever been abused or do not feel safe at home. Summary Adopting a healthy lifestyle and getting preventive care are important in promoting health and wellness. Follow your health care provider's instructions about healthy diet, exercising, and getting tested or screened for diseases. Follow your health care provider's instructions on monitoring your cholesterol and blood pressure. This information is not intended to replace advice given to you by your health care provider. Make sure you discuss any questions you have with your health care provider. Document Revised: 04/21/2021 Document Reviewed: 04/21/2021 Elsevier Patient Education  2023 [employer].

## 2023-04-22 ENCOUNTER — Other Ambulatory Visit: Payer: Self-pay | Admitting: Family Medicine

## 2023-04-22 DIAGNOSIS — Z1231 Encounter for screening mammogram for malignant neoplasm of breast: Secondary | ICD-10-CM

## 2023-04-22 LAB — RPR: RPR Ser Ql: NONREACTIVE

## 2023-04-22 LAB — CBC
Hematocrit: 35.8 % (ref 34.0–46.6)
Hemoglobin: 12.1 g/dL (ref 11.1–15.9)
MCH: 27.5 pg (ref 26.6–33.0)
MCHC: 33.8 g/dL (ref 31.5–35.7)
MCV: 81 fL (ref 79–97)
Platelets: 375 10*3/uL (ref 150–450)
RBC: 4.4 x10E6/uL (ref 3.77–5.28)
RDW: 14.3 % (ref 11.7–15.4)
WBC: 6.4 10*3/uL (ref 3.4–10.8)

## 2023-04-22 LAB — CMP14+EGFR
ALT: 11 IU/L (ref 0–32)
AST: 16 IU/L (ref 0–40)
Albumin/Globulin Ratio: 1.2 (ref 1.2–2.2)
Albumin: 4 g/dL (ref 3.9–4.9)
Alkaline Phosphatase: 91 IU/L (ref 44–121)
BUN/Creatinine Ratio: 14 (ref 9–23)
BUN: 12 mg/dL (ref 6–24)
Bilirubin Total: <0.2 mg/dL (ref 0.0–1.2)
CO2: 24 mmol/L (ref 20–29)
Calcium: 9.7 mg/dL (ref 8.7–10.2)
Chloride: 103 mmol/L (ref 96–106)
Creatinine, Ser: 0.87 mg/dL (ref 0.57–1.00)
Globulin, Total: 3.4 g/dL (ref 1.5–4.5)
Glucose: 79 mg/dL (ref 70–99)
Potassium: 4.5 mmol/L (ref 3.5–5.2)
Sodium: 138 mmol/L (ref 134–144)
Total Protein: 7.4 g/dL (ref 6.0–8.5)
eGFR: 83 mL/min/{1.73_m2} (ref >59)

## 2023-04-22 LAB — TSH: TSH: 10 u[IU]/mL — ABNORMAL HIGH (ref 0.450–4.500)

## 2023-04-22 LAB — HIV ANTIBODY (ROUTINE TESTING W REFLEX): HIV Screen 4th Generation wRfx: NONREACTIVE

## 2023-04-23 LAB — CYTOLOGY - PAP
Chlamydia: NEGATIVE
Comment: NEGATIVE
Comment: NEGATIVE
Comment: NEGATIVE
Comment: NORMAL
Diagnosis: NEGATIVE
High risk HPV: NEGATIVE
Neisseria Gonorrhea: NEGATIVE
Trichomonas: NEGATIVE

## 2023-04-23 LAB — CERVICOVAGINAL ANCILLARY ONLY
Chlamydia: NEGATIVE
Comment: NEGATIVE
Comment: NEGATIVE
Comment: NORMAL
Neisseria Gonorrhea: NEGATIVE
Trichomonas: NEGATIVE

## 2023-04-27 ENCOUNTER — Ambulatory Visit
Admission: RE | Admit: 2023-04-27 | Discharge: 2023-04-27 | Disposition: A | Discharge status: Home / Self Care | Payer: Managed Care, Other (non HMO) | Source: Ambulatory Visit

## 2023-04-27 DIAGNOSIS — Z1231 Encounter for screening mammogram for malignant neoplasm of breast: Secondary | ICD-10-CM

## 2023-04-29 ENCOUNTER — Ambulatory Visit: Payer: Managed Care, Other (non HMO) | Admitting: Family Medicine

## 2023-04-29 ENCOUNTER — Other Ambulatory Visit: Payer: Self-pay | Admitting: Family Medicine

## 2023-04-29 VITALS — BP 116/78 | HR 72 | Ht 62.0 in | Wt 161.6 lb

## 2023-04-29 DIAGNOSIS — N841 Polyp of cervix uteri: Secondary | ICD-10-CM | POA: Diagnosis not present

## 2023-04-29 LAB — POCT URINE PREGNANCY: Preg Test, Ur: NEGATIVE

## 2023-04-29 NOTE — Patient Instructions (Signed)
It was great seeing you today!  Today we removed the cervical polyp. You may have some pain and increased bleeding over the next couple of days. You can use Tylenol or Ibuprofen as needed for the pain.  Let us know if pain persists or you have any other new symptoms.  Feel free to call with any questions or concerns at any time, at [phone].   Take care,  Dr. Cora Collum [facility]

## 2023-04-29 NOTE — Progress Notes (Signed)
    CHIEF COMPLAINT / HPI:  Having some bleeding after intercourse and noted to have a cervical polyp on her annual exam which she desires removal. She is currently having her nomral menstrual cycle.   PERTINENT  PMH / PSH: I have reviewed the patient's medications, allergies, past medical and surgical history, smoking status and updated in the EMR as appropriate.  04/21/2023 pap: NILM OBJECTIVE:  BP 116/78   Pulse 72   Ht 5\' 2"  (1.575 m)   Wt 161 lb 9.6 oz (73.3 kg)   LMP 04/28/2023   SpO2 99%   BMI 29.56 kg/m  GU externally normal. Some small amount of blood seen at os, which was easily removed with swab, revealing a typical appearing cervical polyp extending from  the os.  PROCEDURE NOTE: cervical polypectomy Patient given informed consent, signed copy in the chart..  Negative pregnancy confirmed.  Appropriate time out taken.   Sterile instruments and technique was used. Cervix brought into view with use of speculum and then cleansed three times with  betadine swabs.   Cervical polyp grasped with ring forceps and removed with  traction. No significant pain  or additional bleeding. Patient tolerated procedure well.  Will send polyp for pathology per protocol.  ASSESSMENT / PLAN:  Post procedure instructions given. No problem-specific Assessment & Plan notes found for this encounter.   Denny Levy MD

## 2023-04-30 ENCOUNTER — Telehealth: Payer: Self-pay | Admitting: Family Medicine

## 2023-04-30 DIAGNOSIS — E039 Hypothyroidism, unspecified: Secondary | ICD-10-CM

## 2023-04-30 MED ORDER — LEVOTHYROXINE SODIUM 125 MCG PO TABS
125.0000 ug | ORAL_TABLET | Freq: Every day | ORAL | 1 refills | Status: DC
Start: 2023-04-30 — End: 2023-07-05

## 2023-04-30 NOTE — Telephone Encounter (Signed)
Called patient to discuss elevated TSH of 10 She is compliant with levothyroxine daily Will increase to daily, she is agreeable Plan to recheck TSH when I see her in June  [name], MD

## 2023-05-05 ENCOUNTER — Encounter: Payer: Self-pay | Admitting: Family Medicine

## 2023-06-02 ENCOUNTER — Ambulatory Visit: Payer: Managed Care, Other (non HMO) | Admitting: Family Medicine

## 2023-07-04 ENCOUNTER — Other Ambulatory Visit: Payer: Self-pay | Admitting: Family Medicine

## 2023-07-04 DIAGNOSIS — E039 Hypothyroidism, unspecified: Secondary | ICD-10-CM

## 2023-07-05 ENCOUNTER — Encounter: Payer: Self-pay | Admitting: Family Medicine

## 2023-07-05 DIAGNOSIS — E039 Hypothyroidism, unspecified: Secondary | ICD-10-CM

## 2023-07-12 ENCOUNTER — Other Ambulatory Visit: Payer: Managed Care, Other (non HMO)

## 2023-07-12 DIAGNOSIS — E039 Hypothyroidism, unspecified: Secondary | ICD-10-CM

## 2023-07-14 ENCOUNTER — Encounter: Payer: Self-pay | Admitting: Family Medicine

## 2023-07-14 DIAGNOSIS — E039 Hypothyroidism, unspecified: Secondary | ICD-10-CM

## 2023-07-14 MED ORDER — LEVOTHYROXINE SODIUM 150 MCG PO TABS: 150.0000 ug | ORAL_TABLET | Freq: Every day | ORAL | 0 refills | Status: DC

## 2023-08-17 ENCOUNTER — Other Ambulatory Visit: Payer: Managed Care, Other (non HMO)

## 2023-08-17 DIAGNOSIS — E039 Hypothyroidism, unspecified: Secondary | ICD-10-CM

## 2023-08-18 ENCOUNTER — Encounter: Payer: Self-pay | Admitting: Family Medicine

## 2023-08-18 DIAGNOSIS — E039 Hypothyroidism, unspecified: Secondary | ICD-10-CM

## 2023-08-18 LAB — TSH: TSH: 0.142 u[IU]/mL — ABNORMAL LOW (ref 0.450–4.500)

## 2023-08-18 MED ORDER — LEVOTHYROXINE SODIUM 137 MCG PO TABS: 137.0000 ug | ORAL_TABLET | Freq: Every day | ORAL | 1 refills | Status: DC

## 2023-10-05 ENCOUNTER — Other Ambulatory Visit: Payer: Managed Care, Other (non HMO)

## 2023-10-05 DIAGNOSIS — E039 Hypothyroidism, unspecified: Secondary | ICD-10-CM

## 2023-10-06 LAB — TSH: TSH: 1.8 u[IU]/mL (ref 0.450–4.500)

## 2023-10-08 MED ORDER — LEVOTHYROXINE SODIUM 137 MCG PO TABS: 137.0000 ug | ORAL_TABLET | Freq: Every day | ORAL | 3 refills | Status: DC

## 2023-10-08 NOTE — Addendum Note (Signed)
Addended by: [name] on: 10/08/2023 10:51 AM   Modules accepted: Orders

## 2023-10-29 ENCOUNTER — Other Ambulatory Visit: Payer: Self-pay

## 2023-10-29 MED ORDER — LEVOTHYROXINE SODIUM 137 MCG PO TABS: 137.0000 ug | ORAL_TABLET | Freq: Every day | ORAL | 3 refills | Status: DC

## 2024-01-02 ENCOUNTER — Encounter: Payer: Self-pay | Admitting: Family Medicine

## 2024-01-14 ENCOUNTER — Ambulatory Visit: Payer: Managed Care, Other (non HMO) | Admitting: Student

## 2024-01-20 ENCOUNTER — Encounter: Payer: Self-pay | Admitting: Family Medicine

## 2024-01-20 ENCOUNTER — Ambulatory Visit: Payer: Managed Care, Other (non HMO) | Admitting: Family Medicine

## 2024-01-20 VITALS — BP 132/72 | HR 73 | Ht 62.0 in | Wt 134.4 lb

## 2024-01-20 DIAGNOSIS — Z0000 Encounter for general adult medical examination without abnormal findings: Secondary | ICD-10-CM | POA: Diagnosis not present

## 2024-01-20 DIAGNOSIS — G4700 Insomnia, unspecified: Secondary | ICD-10-CM | POA: Diagnosis not present

## 2024-01-20 DIAGNOSIS — Z3044 Encounter for surveillance of vaginal ring hormonal contraceptive device: Secondary | ICD-10-CM | POA: Diagnosis not present

## 2024-01-20 DIAGNOSIS — K5900 Constipation, unspecified: Secondary | ICD-10-CM

## 2024-01-20 DIAGNOSIS — Z23 Encounter for immunization: Secondary | ICD-10-CM | POA: Diagnosis not present

## 2024-01-20 MED ORDER — LINACLOTIDE 145 MCG PO CAPS: 145.0000 ug | ORAL_CAPSULE | Freq: Every day | ORAL | 0 refills | Status: DC

## 2024-01-20 NOTE — Progress Notes (Signed)
  Date of Visit: 01/20/2024   SUBJECTIVE:   HPI:  Paula Heath presents today for constipation.  Constipation: Reports longstanding history of constipation for years.  Goes 1 to 1.5 weeks without having a bowel movement.  Feels bloated much of the time.  Has tried MiraLAX  twice daily in the past, along with over-the-counter probiotics and over-the-counter stool softeners.  No improvement with any of these medications.  She works out 3 to 5 days a week and drinks a lot of water.  Her sister recently tried Linzess  and had significant improvement, so she would like to try it as well.  Sleep issues: Goes to sleep easily at night, but wakes up 3 times or so during the night.  Is up for 5 to 10 minutes at a time when this happens.  Reports her brain is active, making it difficult to go back to sleep.  Contraception: Using NuvaRing, likes this  OBJECTIVE:   BP 132/72   Pulse 73   Ht 5' 2 (1.575 m)   Wt 134 lb 6.1 oz (61 kg)   SpO2 99%   BMI 24.58 kg/m  Gen: No acute distress, pleasant, cooperative, well-appearing HEENT: Normocephalic, atraumatic Heart: Regular rate and rhythm, no murmur Lungs: Clear to auscultation bilaterally, normal effort Abdomen: Soft, nontender to palpation, no masses or organomegaly, normoactive bowel sounds Neuro: Alert, grossly nonfocal, speech normal   ASSESSMENT/PLAN:   Assessment & Plan Constipation, unspecified constipation type Benign abdominal exam.  Longstanding history of constipation, unresponsive to typical medications.  Agree with trial of Linzess .  Prescription sent in for 145 mcg daily.  She will let me know in a month if it is doing better, if not improved at that time, would go up on the dose. Encounter for surveillance of vaginal ring hormonal contraceptive device Doing well on NuvaRing, continue Routine adult health maintenance Flu, COVID, and Tdap vaccines given today Insomnia, unspecified type Has lost weight, no signs or symptoms of sleep  apnea.  No sleep study needed.  Discussed looking into mindfulness/meditation techniques to help her go back to sleep at night.  She is amenable and will try this.    FOLLOW UP: Follow up in 6 months for hypothyroidism  Cana Mignano J. Donah, MD [facility]

## 2024-01-20 NOTE — Assessment & Plan Note (Signed)
 Has lost weight, no signs or symptoms of sleep apnea.  No sleep study needed.  Discussed looking into mindfulness/meditation techniques to help her go back to sleep at night.  She is amenable and will try this.

## 2024-01-20 NOTE — Assessment & Plan Note (Signed)
 Doing well on NuvaRing, continue

## 2024-01-20 NOTE — Assessment & Plan Note (Signed)
 Benign abdominal exam.  Longstanding history of constipation, unresponsive to typical medications.  Agree with trial of Linzess .  Prescription sent in for 145 mcg daily.  She will let me know in a month if it is doing better, if not improved at that time, would go up on the dose.

## 2024-01-20 NOTE — Assessment & Plan Note (Signed)
 Flu, COVID, and Tdap vaccines given today

## 2024-01-20 NOTE — Patient Instructions (Signed)
 It was great to see you again today.  Start linzess  145mcg daily - sent this in for you Let me know if still having issues in 1 month and we can then increase the dose  Flu, COVID, and Tdap shots today  Amazing job with the exercise and weight loss - I am proud of you!!  Follow up in 6 months for next thyroid  check, sooner if needed  Be well, Dr. Donah

## 2024-03-12 ENCOUNTER — Encounter: Payer: Self-pay | Admitting: Family Medicine

## 2024-03-13 MED ORDER — LINACLOTIDE 145 MCG PO CAPS: 145.0000 ug | ORAL_CAPSULE | Freq: Every day | ORAL | 1 refills | Status: DC

## 2024-03-26 ENCOUNTER — Other Ambulatory Visit: Payer: Self-pay | Admitting: Family Medicine

## 2024-05-01 ENCOUNTER — Encounter: Payer: Self-pay | Admitting: Family Medicine

## 2024-05-01 DIAGNOSIS — R5383 Other fatigue: Secondary | ICD-10-CM

## 2024-05-02 NOTE — Telephone Encounter (Signed)
 Orders entered. Please assist patient with scheduling lab visit. If any labs are abnormal she will need to come in for a visit to discuss  Thanks Candee Cha, MD

## 2024-05-04 ENCOUNTER — Other Ambulatory Visit (INDEPENDENT_AMBULATORY_CARE_PROVIDER_SITE_OTHER)

## 2024-05-04 DIAGNOSIS — R5383 Other fatigue: Secondary | ICD-10-CM

## 2024-05-05 ENCOUNTER — Ambulatory Visit: Payer: Self-pay | Admitting: Family Medicine

## 2024-05-05 DIAGNOSIS — E039 Hypothyroidism, unspecified: Secondary | ICD-10-CM

## 2024-05-05 LAB — VITAMIN D 25 HYDROXY (VIT D DEFICIENCY, FRACTURES): Vit D, 25-Hydroxy: 67.8 ng/mL (ref 30.0–100.0)

## 2024-05-05 LAB — TSH: TSH: 0.217 u[IU]/mL — ABNORMAL LOW (ref 0.450–4.500)

## 2024-05-05 LAB — VITAMIN B12: Vitamin B-12: 600 pg/mL (ref 232–1245)

## 2024-05-05 LAB — FOLLICLE STIMULATING HORMONE: FSH: 8.3 m[IU]/mL

## 2024-05-05 MED ORDER — LEVOTHYROXINE SODIUM 125 MCG PO TABS: 137.0000 ug | ORAL_TABLET | Freq: Every day | ORAL | 1 refills | Status: DC

## 2024-05-06 ENCOUNTER — Encounter: Payer: Self-pay | Admitting: Family Medicine

## 2024-05-06 ENCOUNTER — Other Ambulatory Visit: Payer: Self-pay | Admitting: Family Medicine

## 2024-05-06 DIAGNOSIS — Z3044 Encounter for surveillance of vaginal ring hormonal contraceptive device: Secondary | ICD-10-CM

## 2024-05-09 NOTE — Telephone Encounter (Signed)
 Apt made for 6/24.

## 2024-06-06 ENCOUNTER — Ambulatory Visit (INDEPENDENT_AMBULATORY_CARE_PROVIDER_SITE_OTHER): Admitting: Family Medicine

## 2024-06-06 VITALS — BP 134/72 | HR 78 | Ht 62.0 in | Wt 140.8 lb

## 2024-06-06 DIAGNOSIS — Z0000 Encounter for general adult medical examination without abnormal findings: Secondary | ICD-10-CM | POA: Diagnosis not present

## 2024-06-06 DIAGNOSIS — Z3044 Encounter for surveillance of vaginal ring hormonal contraceptive device: Secondary | ICD-10-CM | POA: Diagnosis not present

## 2024-06-06 DIAGNOSIS — E039 Hypothyroidism, unspecified: Secondary | ICD-10-CM | POA: Diagnosis not present

## 2024-06-06 DIAGNOSIS — K5900 Constipation, unspecified: Secondary | ICD-10-CM

## 2024-06-06 DIAGNOSIS — Z13220 Encounter for screening for lipoid disorders: Secondary | ICD-10-CM

## 2024-06-06 DIAGNOSIS — Z0184 Encounter for antibody response examination: Secondary | ICD-10-CM

## 2024-06-06 MED ORDER — ETONOGESTREL-ETHINYL ESTRADIOL 0.12-0.015 MG/24HR VA RING: VAGINAL_RING | VAGINAL | 11 refills | Status: AC

## 2024-06-06 NOTE — Progress Notes (Unsigned)
  Date of Visit: 06/06/2024   SUBJECTIVE:   HPI:  Paula Heath presents today for a well woman exam.   Concerns today: check TSH Periods: no issues Contraception: nuvaring, happy with this form of BC Pelvic symptoms: denies STD Screening: declines Pap smear status: UTD Exercise: multiple times per week Smoking: no Alcohol: no or occasional Drugs: no Mood: doing well overall Dentist: goes 2x/year  Hypothyroidism: Currently taking Synthroid  125 mcg daily.  Tolerating this well.  Constipation: Taking Linzess  145 mcg daily.  It overall works fairly well for her.  OBJECTIVE:   BP 134/72   Pulse 78   Ht 5' 2 (1.575 m)   Wt 140 lb 12.8 oz (63.9 kg)   SpO2 100%   BMI 25.75 kg/m  Gen: NAD, pleasant, cooperative HEENT: NCAT, PERRL, no palpable thyromegaly or anterior cervical lymphadenopathy Heart: RRR, no murmurs Lungs: CTAB, NWOB Abdomen: soft, nontender to palpation Neuro: grossly nonfocal, speech normal   ASSESSMENT/PLAN:   Assessment & Plan Routine adult health maintenance -STD screening: declines today -pap smear: UTD -mammogram: advised to schedule -lipid screening: update lipids today -colon cancer screening: UTD -immunizations: UTD on appropriate immunizations -handout given on health maintenance topics Hypothyroidism, unspecified type Check TSH today, titrate synthroid  as needed Encounter for surveillance of vaginal ring hormonal contraceptive device Refill NuvaRing Constipation, unspecified constipation type Doing well on Linzess , continue  FOLLOW UP: Follow up in 1 year for CPE  Paula J. Donah, MD [facility]

## 2024-06-06 NOTE — Patient Instructions (Signed)
 It was great to see you again today.  Checking labs Follow up in 1 year, sooner if needed  Be well, Dr. Donah   Health Maintenance, Female Adopting a healthy lifestyle and getting preventive care are important in promoting health and wellness. Ask your health care provider about: The right schedule for you to have regular tests and exams. Things you can do on your own to prevent diseases and keep yourself healthy. What should I know about diet, weight, and exercise? Eat a healthy diet  Eat a diet that includes plenty of vegetables, fruits, low-fat dairy products, and lean protein. Do not eat a lot of foods that are high in solid fats, added sugars, or sodium. Maintain a healthy weight Body mass index (BMI) is used to identify weight problems. It estimates body fat based on height and weight. Your health care provider can help determine your BMI and help you achieve or maintain a healthy weight. Get regular exercise Get regular exercise. This is one of the most important things you can do for your health. Most adults should: Exercise for at least 150 minutes each week. The exercise should increase your heart rate and make you sweat (moderate-intensity exercise). Do strengthening exercises at least twice a week. This is in addition to the moderate-intensity exercise. Spend less time sitting. Even light physical activity can be beneficial. Watch cholesterol and blood lipids Have your blood tested for lipids and cholesterol at 48 years of age, then have this test every 5 years. Have your cholesterol levels checked more often if: Your lipid or cholesterol levels are high. You are older than 48 years of age. You are at high risk for heart disease. What should I know about cancer screening? Depending on your health history and family history, you may need to have cancer screening at various ages. This may include screening for: Breast cancer. Cervical cancer. Colorectal cancer. Skin  cancer. Lung cancer. What should I know about heart disease, diabetes, and high blood pressure? Blood pressure and heart disease High blood pressure causes heart disease and increases the risk of stroke. This is more likely to develop in people who have high blood pressure readings or are overweight. Have your blood pressure checked: Every 3-5 years if you are 64-17 years of age. Every year if you are 2 years old or older. Diabetes Have regular diabetes screenings. This checks your fasting blood sugar level. Have the screening done: Once every three years after age 87 if you are at a normal weight and have a low risk for diabetes. More often and at a younger age if you are overweight or have a high risk for diabetes. What should I know about preventing infection? Hepatitis B If you have a higher risk for hepatitis B, you should be screened for this virus. Talk with your health care provider to find out if you are at risk for hepatitis B infection. Hepatitis C Testing is recommended for: Everyone born from [birth year] through 1965. Anyone with known risk factors for hepatitis C. Sexually transmitted infections (STIs) Get screened for STIs, including gonorrhea and chlamydia, if: You are sexually active and are younger than 48 years of age. You are older than 48 years of age and your health care provider tells you that you are at risk for this type of infection. Your sexual activity has changed since you were last screened, and you are at increased risk for chlamydia or gonorrhea. Ask your health care provider if you are at risk. Ask  your health care provider about whether you are at high risk for HIV. Your health care provider may recommend a prescription medicine to help prevent HIV infection. If you choose to take medicine to prevent HIV, you should first get tested for HIV. You should then be tested every 3 months for as long as you are taking the medicine. Pregnancy If you are about to stop  having your period (premenopausal) and you may become pregnant, seek counseling before you get pregnant. Take 400 to 800 micrograms (mcg) of folic acid every day if you become pregnant. Ask for birth control (contraception) if you want to prevent pregnancy. Osteoporosis and menopause Osteoporosis is a disease in which the bones lose minerals and strength with aging. This can result in bone fractures. If you are 37 years old or older, or if you are at risk for osteoporosis and fractures, ask your health care provider if you should: Be screened for bone loss. Take a calcium or vitamin D  supplement to lower your risk of fractures. Be given hormone replacement therapy (HRT) to treat symptoms of menopause. Follow these instructions at home: Alcohol use Do not drink alcohol if: Your health care provider tells you not to drink. You are pregnant, may be pregnant, or are planning to become pregnant. If you drink alcohol: Limit how much you have to: 0-1 drink a day. Know how much alcohol is in your drink. In the [place] one drink equals one 12 oz bottle of beer (355 mL), one 5 oz glass of wine (148 mL), or one 1 oz glass of hard liquor (44 mL). Lifestyle Do not use any products that contain nicotine or tobacco. These products include cigarettes, chewing tobacco, and vaping devices, such as e-cigarettes. If you need help quitting, ask your health care provider. Do not use street drugs. Do not share needles. Ask your health care provider for help if you need support or information about quitting drugs. General instructions Schedule regular health, dental, and eye exams. Stay current with your vaccines. Tell your health care provider if: You often feel depressed. You have ever been abused or do not feel safe at home. Summary Adopting a healthy lifestyle and getting preventive care are important in promoting health and wellness. Follow your health care provider's instructions about healthy diet,  exercising, and getting tested or screened for diseases. Follow your health care provider's instructions on monitoring your cholesterol and blood pressure. This information is not intended to replace advice given to you by your health care provider. Make sure you discuss any questions you have with your health care provider. Document Revised: 04/21/2021 Document Reviewed: 04/21/2021 Elsevier Patient Education  2024 [employer].

## 2024-06-07 ENCOUNTER — Ambulatory Visit: Payer: Self-pay | Admitting: Family Medicine

## 2024-06-07 DIAGNOSIS — Z23 Encounter for immunization: Secondary | ICD-10-CM

## 2024-06-07 LAB — LIPID PANEL
Chol/HDL Ratio: 2.7 ratio (ref 0.0–4.4)
Cholesterol, Total: 228 mg/dL — ABNORMAL HIGH (ref 100–199)
HDL: 83 mg/dL (ref >39)
LDL Chol Calc (NIH): 135 mg/dL — ABNORMAL HIGH (ref 0–99)
Triglycerides: 59 mg/dL (ref 0–149)
VLDL Cholesterol Cal: 10 mg/dL (ref 5–40)

## 2024-06-07 LAB — TSH: TSH: 2.38 u[IU]/mL (ref 0.450–4.500)

## 2024-06-07 LAB — HEPATITIS B SURFACE ANTIGEN: Hepatitis B Surface Ag: NEGATIVE

## 2024-06-07 LAB — HEPATITIS B SURFACE ANTIBODY, QUANTITATIVE: Hepatitis B Surf Ab Quant: 73.1 m[IU]/mL

## 2024-06-07 MED ORDER — LEVOTHYROXINE SODIUM 125 MCG PO TABS: 137.0000 ug | ORAL_TABLET | Freq: Every day | ORAL | 3 refills | Status: DC

## 2024-06-08 NOTE — Assessment & Plan Note (Signed)
 Doing well on Linzess , continue

## 2024-06-08 NOTE — Assessment & Plan Note (Signed)
 Refill NuvaRing

## 2024-06-08 NOTE — Assessment & Plan Note (Signed)
Check TSH today, titrate synthroid as needed.

## 2024-06-08 NOTE — Assessment & Plan Note (Signed)
-  STD screening: declines today -pap smear: UTD -mammogram: advised to schedule -lipid screening: update lipids today -colon cancer screening: UTD -immunizations: UTD on appropriate immunizations -handout given on health maintenance topics

## 2024-06-14 ENCOUNTER — Other Ambulatory Visit: Payer: Self-pay | Admitting: Family Medicine

## 2024-06-14 DIAGNOSIS — Z1231 Encounter for screening mammogram for malignant neoplasm of breast: Secondary | ICD-10-CM

## 2024-06-18 ENCOUNTER — Other Ambulatory Visit: Payer: Self-pay | Admitting: Medical Genetics

## 2024-06-20 ENCOUNTER — Other Ambulatory Visit: Payer: Self-pay | Admitting: Family Medicine

## 2024-06-20 DIAGNOSIS — Z1231 Encounter for screening mammogram for malignant neoplasm of breast: Secondary | ICD-10-CM

## 2024-06-22 ENCOUNTER — Encounter

## 2024-06-22 DIAGNOSIS — Z1231 Encounter for screening mammogram for malignant neoplasm of breast: Secondary | ICD-10-CM

## 2024-06-23 ENCOUNTER — Ambulatory Visit
Admission: RE | Admit: 2024-06-23 | Discharge: 2024-06-23 | Disposition: A | Discharge status: Home / Self Care | Source: Ambulatory Visit

## 2024-06-23 DIAGNOSIS — Z1231 Encounter for screening mammogram for malignant neoplasm of breast: Secondary | ICD-10-CM

## 2024-06-24 ENCOUNTER — Other Ambulatory Visit
Admission: RE | Admit: 2024-06-24 | Discharge: 2024-06-24 | Disposition: A | Discharge status: Home / Self Care | Payer: Self-pay | Source: Ambulatory Visit | Attending: Medical Genetics | Admitting: Medical Genetics

## 2024-07-07 LAB — GENECONNECT MOLECULAR SCREEN: Genetic Analysis Overall Interpretation: NEGATIVE

## 2024-07-13 ENCOUNTER — Encounter: Payer: Self-pay | Admitting: Family Medicine

## 2024-07-13 ENCOUNTER — Telehealth: Payer: Self-pay | Admitting: Family Medicine

## 2024-07-13 NOTE — Telephone Encounter (Signed)
 Called pt regarding recent Mychart message. She denies SI. Discussed list of therapists sent via Mychart. She notes she has [employer] not Medicaid.   For information on therapists, please go to [email or web address]. You can also contact your insurance company to find an Hospital doctor.  Sent via Mychart. Discussed making an appt if wanting to do medications.  Rollene Keeling MD

## 2024-07-28 ENCOUNTER — Ambulatory Visit: Admitting: Family Medicine

## 2024-07-28 ENCOUNTER — Ambulatory Visit

## 2024-08-01 ENCOUNTER — Ambulatory Visit (INDEPENDENT_AMBULATORY_CARE_PROVIDER_SITE_OTHER): Admitting: Family Medicine

## 2024-08-01 VITALS — BP 108/74 | HR 74 | Temp 97.9°F | Wt 144.1 lb

## 2024-08-01 DIAGNOSIS — H18891 Other specified disorders of cornea, right eye: Secondary | ICD-10-CM

## 2024-08-01 NOTE — Patient Instructions (Addendum)
 It was wonderful to see you today!  The red spot on your eye does not look like a cut or abrasion, or a burst blood vessel.  Since it is getting better I do not think we need to do any medicine or other treatment at this time, however I would like you to see an optometrist for a second opinion.  Below is a list of optometrist in the area who are excepting patients.  [facility] [address] Phone: [phone]  [facility] [address] Phone [phone]  [facility] [address] Phone: [phone]  You can also see your regular optometrist if you have one already.  Please call [phone] with any questions about today's appointment.   If you need any additional refills, please call your pharmacy before calling the office.  Lucie Pinal, DO Family Medicine

## 2024-08-01 NOTE — Progress Notes (Signed)
    SUBJECTIVE:   CHIEF COMPLAINT / HPI:   Patient presents with 2 months of redness in the right eye on the medial side of the iris.  Initially the area was painful and quite Snowden red but since the initial incident, the pain has resolved and the area has gotten less red.  She has no specific incident of trauma which incited this event she just woke up 1 day with the redness in her eye.  She denies any vision changes or ongoing pain or swelling.  PERTINENT  PMH / PSH: No contributory  OBJECTIVE:   BP 108/74   Pulse 74   Temp 97.9 F (36.6 C) (Oral)   Wt 144 lb 2 oz (65.4 kg)   LMP 07/29/2024   SpO2 100%   BMI 26.36 kg/m   General: Well-appearing, no distress HEENT: PERRLA, EOMI.  4 mm x 4 mm well-circumscribed area of redness appears raised off the sclera.  No injection or tearing noted.  Fluorescein eye exam did not show any evidence of abrasion or foreign body presence.  No tenderness to palpation over the lid, sinus or globe on the right.  Left eye grossly normal.  ASSESSMENT/PLAN:   Assessment & Plan Corneal erythema of right eye - Pain has improved and patient reports that side is of red area has also improved -Recommend further exam by optometrist -Provided return precautions   Lucie Pinal, DO [facility]

## 2024-09-07 ENCOUNTER — Encounter: Payer: Self-pay | Admitting: Family Medicine

## 2024-09-11 MED ORDER — LEVOTHYROXINE SODIUM 125 MCG PO TABS: 137.0000 ug | ORAL_TABLET | Freq: Every day | ORAL | 3 refills | Status: AC
